# Patient Record
Sex: Male | Born: 1945 | Race: White | Hispanic: No | Marital: Married | Smoking: Former smoker
Health system: Southern US, Community
[De-identification: ages and names within clinical notes are randomized; demographics above are authoritative.]

## PROBLEM LIST (undated history)

## (undated) DIAGNOSIS — K219 Gastro-esophageal reflux disease without esophagitis: Secondary | ICD-10-CM

## (undated) DIAGNOSIS — M549 Dorsalgia, unspecified: Secondary | ICD-10-CM

## (undated) DIAGNOSIS — M1990 Unspecified osteoarthritis, unspecified site: Secondary | ICD-10-CM

## (undated) DIAGNOSIS — I10 Essential (primary) hypertension: Secondary | ICD-10-CM

## (undated) DIAGNOSIS — I2510 Atherosclerotic heart disease of native coronary artery without angina pectoris: Secondary | ICD-10-CM

## (undated) DIAGNOSIS — R809 Proteinuria, unspecified: Secondary | ICD-10-CM

## (undated) DIAGNOSIS — G8929 Other chronic pain: Secondary | ICD-10-CM

## (undated) DIAGNOSIS — N200 Calculus of kidney: Secondary | ICD-10-CM

## (undated) DIAGNOSIS — I219 Acute myocardial infarction, unspecified: Secondary | ICD-10-CM

## (undated) HISTORY — DX: Dorsalgia, unspecified: M54.9

## (undated) HISTORY — DX: Calculus of kidney: N20.0

## (undated) HISTORY — DX: Proteinuria, unspecified: R80.9

## (undated) HISTORY — PX: TONSILLECTOMY: SUR1361

## (undated) HISTORY — DX: Atherosclerotic heart disease of native coronary artery without angina pectoris: I25.10

## (undated) HISTORY — PX: CARDIAC CATHETERIZATION: SHX172

## (undated) HISTORY — DX: Other chronic pain: G89.29

## (undated) HISTORY — PX: ROTATOR CUFF REPAIR: SHX139

## (undated) HISTORY — PX: HERNIA REPAIR: SHX51

## (undated) HISTORY — DX: Essential (primary) hypertension: I10

---

## 2007-07-11 ENCOUNTER — Ambulatory Visit (HOSPITAL_COMMUNITY): Admission: RE | Admit: 2007-07-11 | Discharge: 2007-07-11 | Payer: Self-pay | Admitting: Urology

## 2007-07-12 ENCOUNTER — Ambulatory Visit (HOSPITAL_COMMUNITY): Admission: RE | Admit: 2007-07-12 | Discharge: 2007-07-12 | Payer: Self-pay | Admitting: Urology

## 2007-07-19 ENCOUNTER — Ambulatory Visit (HOSPITAL_COMMUNITY): Admission: RE | Admit: 2007-07-19 | Discharge: 2007-07-19 | Payer: Self-pay | Admitting: Urology

## 2007-07-19 IMAGING — CR DG ABDOMEN 1V
2 series · 2 of 2 positions shown · non-contrast
Comparison: [DATE].

CLINICAL DATA: Kidney stone.

ABDOMEN - 1 VIEW

[view not recorded (1 of 2)]
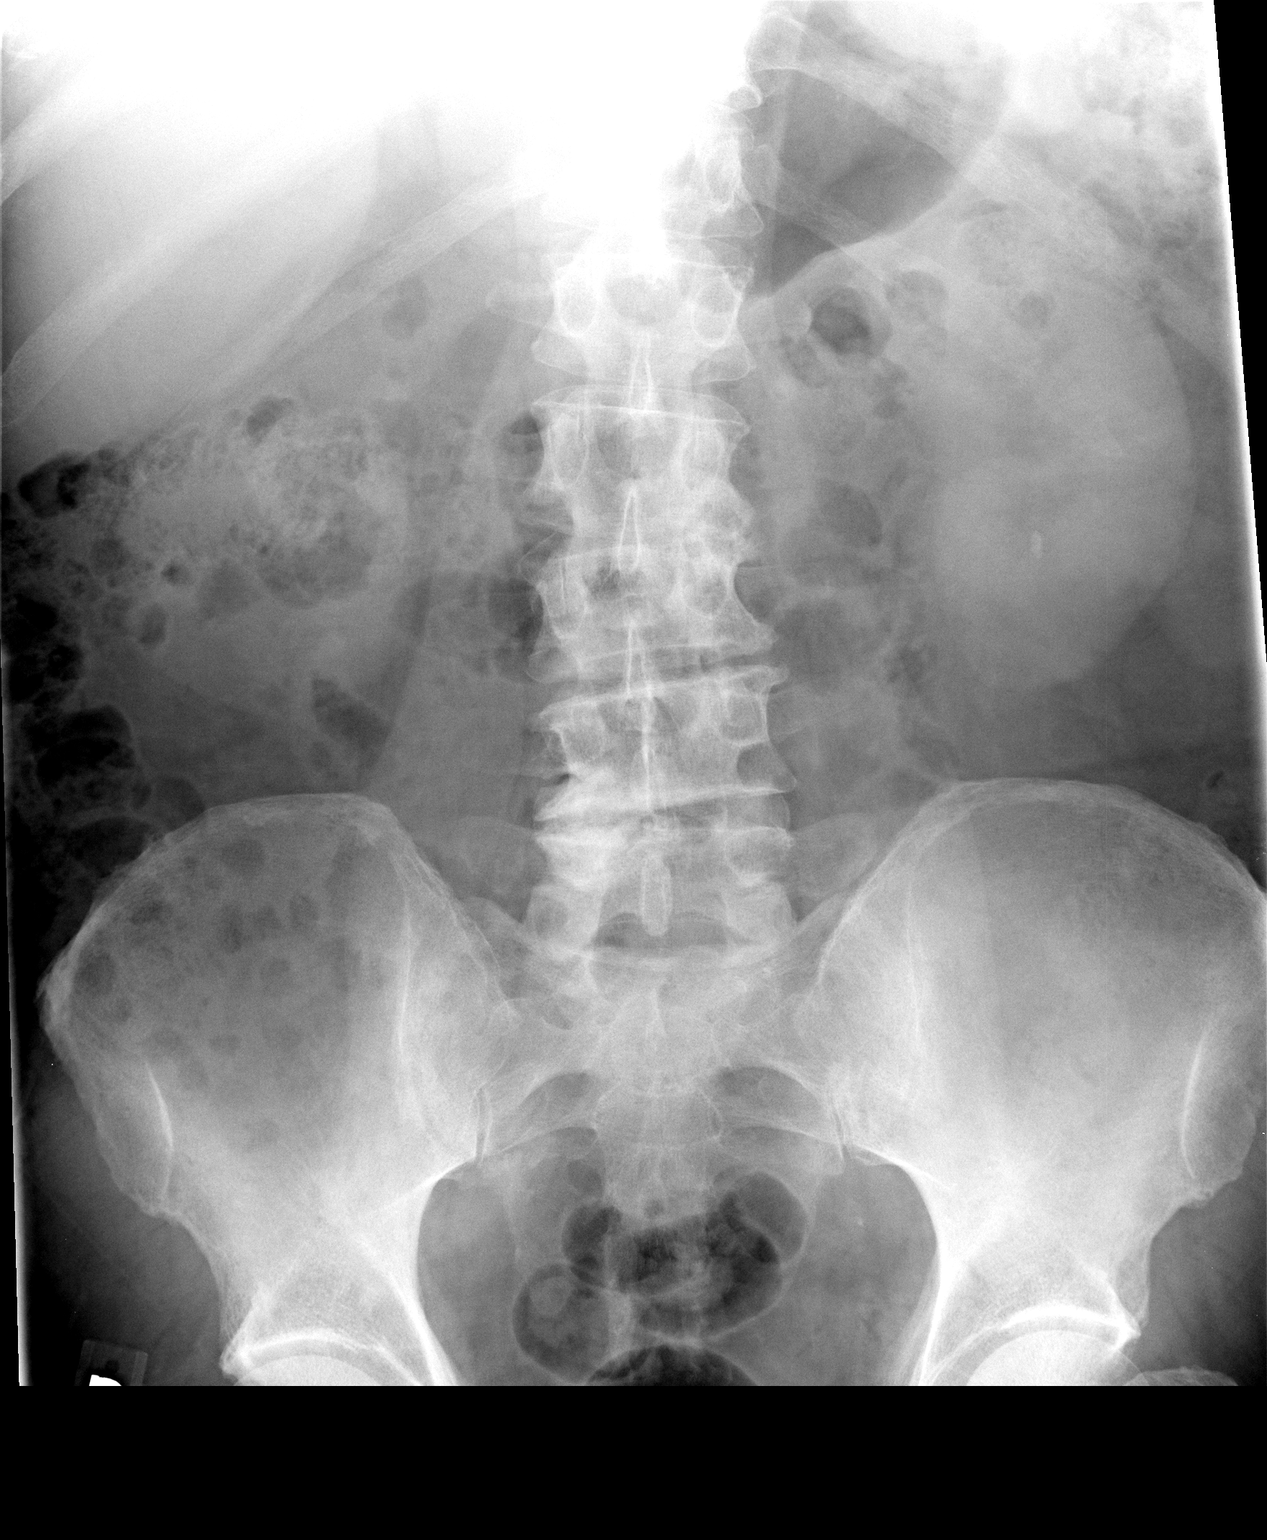

[view not recorded (2 of 2)]
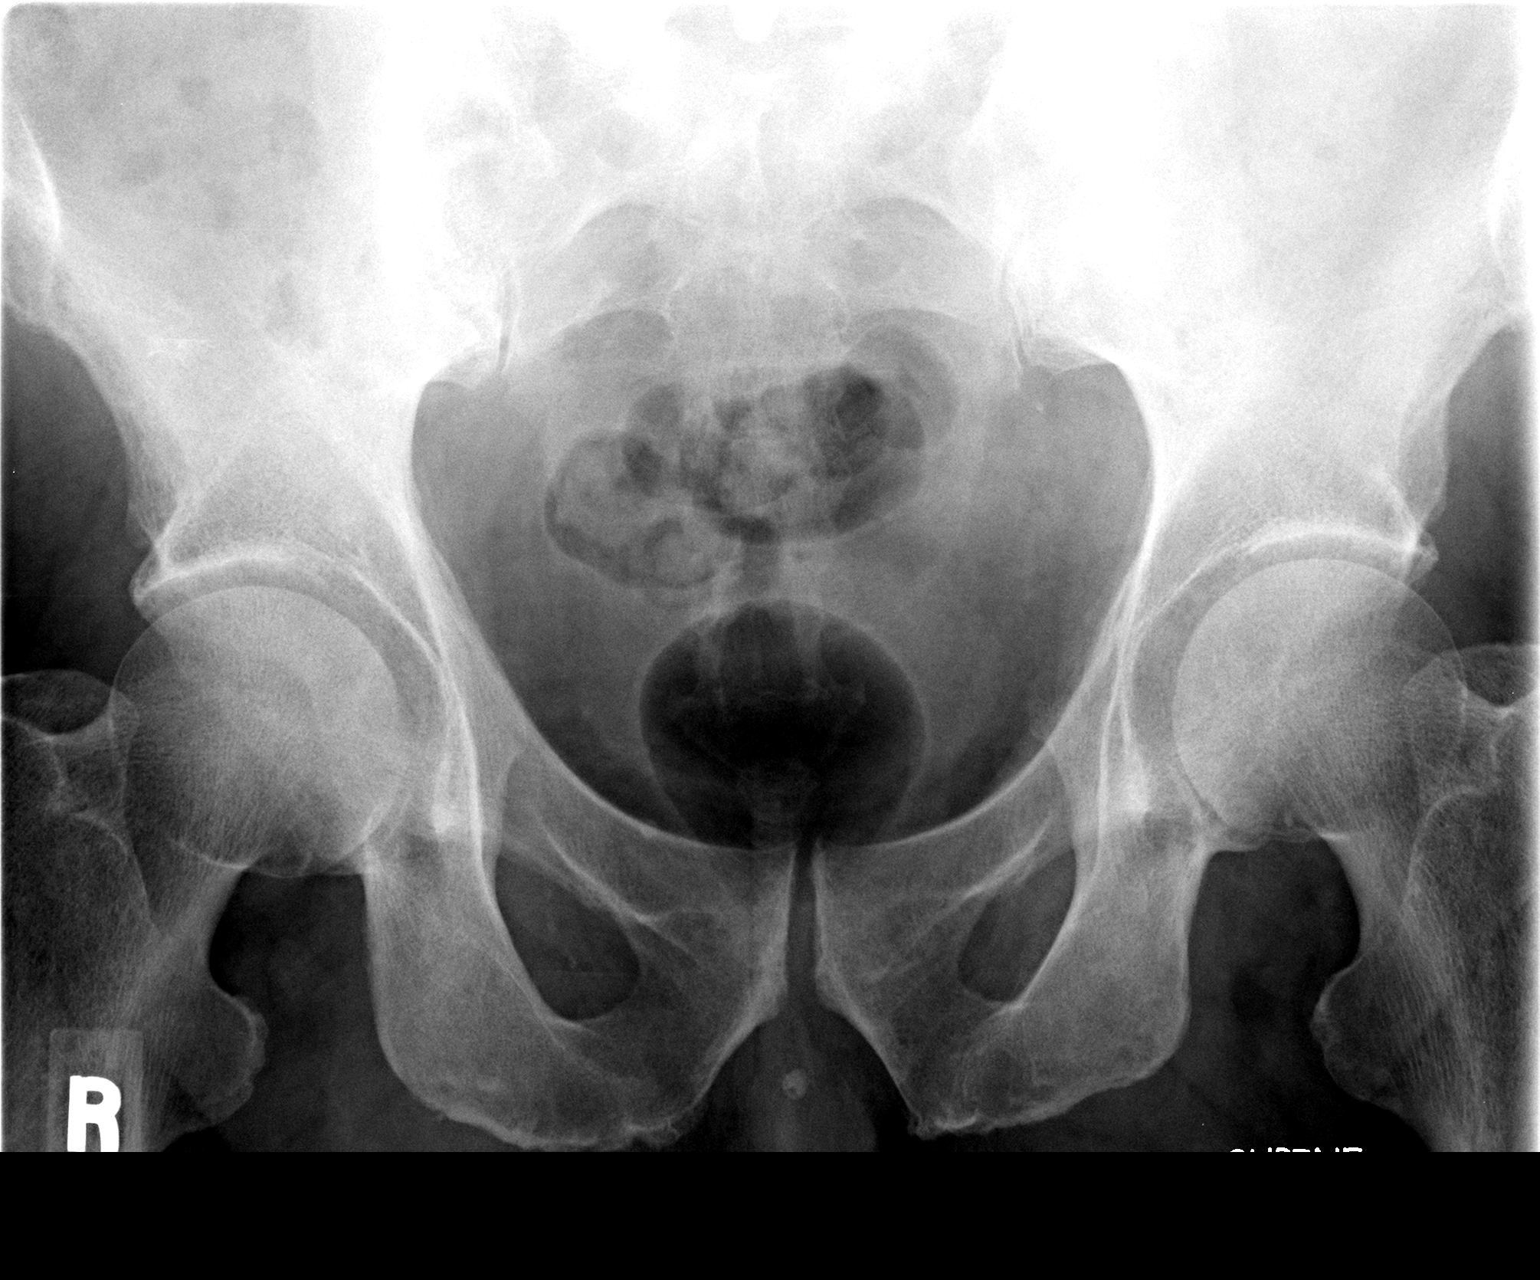

[2 of 2 positions shown; findings below may reference images not displayed]

FINDINGS: An 8 mm stone is seen projecting over the lower pole of
the left renal outline.  No definite stones project over the right
renal outline or expected course of the ureters bilaterally.
IMPRESSION: 1.  Probable left kidney stone.

## 2008-03-08 DIAGNOSIS — I219 Acute myocardial infarction, unspecified: Secondary | ICD-10-CM

## 2008-03-08 HISTORY — DX: Acute myocardial infarction, unspecified: I21.9

## 2008-04-26 ENCOUNTER — Encounter: Payer: Self-pay | Admitting: Cardiology

## 2008-04-27 ENCOUNTER — Encounter: Payer: Self-pay | Admitting: Cardiology

## 2008-04-27 ENCOUNTER — Ambulatory Visit: Payer: Self-pay | Admitting: Cardiovascular Disease

## 2008-04-27 ENCOUNTER — Inpatient Hospital Stay (HOSPITAL_COMMUNITY): Admission: AD | Admit: 2008-04-27 | Discharge: 2008-04-30 | Payer: Self-pay | Admitting: Cardiovascular Disease

## 2008-04-30 ENCOUNTER — Encounter: Payer: Self-pay | Admitting: Cardiology

## 2008-05-20 ENCOUNTER — Ambulatory Visit: Payer: Self-pay | Admitting: Cardiology

## 2008-05-21 ENCOUNTER — Encounter: Payer: Self-pay | Admitting: Cardiology

## 2008-06-28 ENCOUNTER — Encounter: Payer: Self-pay | Admitting: Cardiology

## 2008-12-06 DIAGNOSIS — I2510 Atherosclerotic heart disease of native coronary artery without angina pectoris: Secondary | ICD-10-CM | POA: Insufficient documentation

## 2008-12-06 DIAGNOSIS — I10 Essential (primary) hypertension: Secondary | ICD-10-CM

## 2009-03-03 ENCOUNTER — Encounter (INDEPENDENT_AMBULATORY_CARE_PROVIDER_SITE_OTHER): Payer: Self-pay | Admitting: *Deleted

## 2009-04-08 ENCOUNTER — Telehealth (INDEPENDENT_AMBULATORY_CARE_PROVIDER_SITE_OTHER): Payer: Self-pay | Admitting: *Deleted

## 2009-05-20 ENCOUNTER — Ambulatory Visit: Payer: Self-pay | Admitting: Cardiology

## 2009-05-20 DIAGNOSIS — E119 Type 2 diabetes mellitus without complications: Secondary | ICD-10-CM | POA: Insufficient documentation

## 2010-04-07 NOTE — Progress Notes (Signed)
Summary: Plavix Question  Phone Note Call from Patient Call back at Home Phone [phone]   Caller: Spouse Summary of Call: Pt's wife called stating he was told he could stop his Plavix after one year. She is wanting to know when he can stop it. Notified pt's wife to have him continue Plavix until he sees Dr. Earnestine Leys at his scheduled appt on March 15. Pt's wife verbalized understanding. Initial call taken by: Cyril Loosen, RN, BSN,  April 08, 2009 11:12 AM

## 2010-04-07 NOTE — Assessment & Plan Note (Signed)
Summary: 1 yr fu would like to come off plavix   Visit Type:  Follow-up Primary Provider:  Sherril Croon  CC:  follow-up visit.  History of Present Illness: the patient is a 65 year old male history of coronary disease , status post successful drug-eluting stent placement to the LAD. The patient has non-insulin dependent diabetes mellitus he has discontinued tobacco use.  The patient has no microalbuminuria. He reports occasional left shoulder tightness but no substernal chest pain or any exertional related chest pain. The pain appears to be non-anginal. He denies orthopnea PND palpitations or syncope.  Preventive Screening-Counseling & Management  Alcohol-Tobacco     Smoking Status: quit     Year Quit: 04/2008  Current Medications (verified): 1)  Metoprolol Tartrate 25 Mg Tabs (Metoprolol Tartrate) .... Take 1/2 Tablet By Mouth Twice A Day. 2)  Plavix 75 Mg Tabs (Clopidogrel Bisulfate) .... Take 1 Tablet By Mouth Once A Day 3)  Aspir-Trin 325 Mg Tbec (Aspirin) .... Take 1 Tablet By Mouth Once A Day 4)  Ranitidine Hcl 150 Mg Tabs (Ranitidine Hcl) .... Take 1 Tablet By Mouth Two Times A Day 5)  Simvastatin 80 Mg Tabs (Simvastatin) .... Take 1 Tablet By Mouth Once A Day 6)  Metformin Hcl 500 Mg Tabs (Metformin Hcl) .... Take 1 Tablet By Mouth Once A Day 7)  Hydrocodone-Acetaminophen 5-500 Mg Tabs (Hydrocodone-Acetaminophen) .... Take 1 Tablet By Mouth Three Times A Day As Needed 8)  Novolin 70/30 70-30 % Susp (Insulin Isophane & Regular) .... Take 32u Subcutaneously in Am and 30u Subcutaneously in Pm  Allergies (verified): No Known Drug Allergies  Comments:  Nurse/Medical Assistant: The patient's medications and allergies were reviewed with the patient and were updated in the Medication and Allergy Lists. List reviewed.  Past History:  Past Medical History: Last updated: 12/06/2008 HYPERTENSION, UNSPECIFIED (ICD-401.9) CAD, NATIVE VESSEL (ICD-414.01) Non-insulin-dependent diabetes  mellitus History of nephrolithiasis Chronic back pain  Past Surgical History: Last updated: 12/06/2008 Rotator Cuff Repair Hernia repair  Family History: Last updated: 12/06/2008 Family History of Cancer:   Social History: Last updated: 12/06/2008 Disabled  Married  Tobacco Use - Yes.  Alcohol Use - no Drug Use - no  Risk Factors: Smoking Status: quit (05/20/2009)  Social History: Smoking Status:  quit  Review of Systems  The patient denies fatigue, malaise, fever, weight gain/loss, vision loss, decreased hearing, hoarseness, chest pain, palpitations, shortness of breath, prolonged cough, wheezing, sleep apnea, coughing up blood, abdominal pain, blood in stool, nausea, vomiting, diarrhea, heartburn, incontinence, blood in urine, muscle weakness, joint pain, leg swelling, rash, skin lesions, headache, fainting, dizziness, depression, anxiety, enlarged lymph nodes, easy bruising or bleeding, and environmental allergies.    Vital Signs:  Patient profile:   65 year old male Height:      68 inches Weight:      216 pounds BMI:     32.96 Pulse rate:   54 / minute BP sitting:   134 / 82  (left arm) Cuff size:   large  Vitals Entered By: Carlye Grippe (May 20, 2009 10:01 AM)  Nutrition Counseling: Patient's BMI is greater than 25 and therefore counseled on weight management options. CC: follow-up visit   Physical Exam  Additional Exam:  General: Well-developed, well-nourished in no distress head: Normocephalic and atraumatic eyes PERRLA/EOMI intact, conjunctiva and lids normal nose: No deformity or lesions mouth normal dentition, normal posterior pharynx neck: Supple, no JVD.  No masses, thyromegaly or abnormal cervical nodes lungs: Normal breath sounds bilaterally without wheezing.  Normal percussion heart: regular rate and rhythm with normal S1 and S2, no S3 or S4.  PMI is normal.  No pathological murmurs abdomen: Normal bowel sounds, abdomen is soft and nontender  without masses, organomegaly or hernias noted.  No hepatosplenomegaly musculoskeletal: Back normal, normal gait muscle strength and tone normal pulsus: Pulse is normal in all 4 extremities Extremities: No peripheral pitting edema neurologic: Alert and oriented x 3 skin: Intact without lesions or rashes cervical nodes: No significant adenopathy psychologic: Normal affect    EKG  Procedure date:  05/20/2009  Findings:      sinus bradycardia. Otherwise normal EKG. Heart rate 51 beats per minute.  Impression & Recommendations:  Problem # 1:  CAD, NATIVE VESSEL (ICD-414.01) the patient denies intercurrent chest pain. His EKG is within normal limits. Continue aspirin and Plavix. His updated medication list for this problem includes:    Metoprolol Tartrate 25 Mg Tabs (Metoprolol tartrate) .Marland Kitchen... Take 1/2 tablet by mouth twice a day.    Plavix 75 Mg Tabs (Clopidogrel bisulfate) .Marland Kitchen... Take 1 tablet by mouth once a day    Aspir-trin 325 Mg Tbec (Aspirin) .Marland Kitchen... Take 1 tablet by mouth once a day  Orders: EKG w/ Interpretation (93000)  Problem # 2:  HYPERTENSION, UNSPECIFIED (ICD-401.9) blood pressure is well controlled on metoprolol. His updated medication list for this problem includes:    Metoprolol Tartrate 25 Mg Tabs (Metoprolol tartrate) .Marland Kitchen... Take 1/2 tablet by mouth twice a day.    Aspir-trin 325 Mg Tbec (Aspirin) .Marland Kitchen... Take 1 tablet by mouth once a day  Problem # 3:  DM (ICD-250.00) no evidence of microalbuminuria. Patient started on metformin. As well as Novolin. The patient will follow up with his primary care physician. The following medications were removed from the medication list:    Glipizide 5 Mg Tabs (Glipizide) .Marland Kitchen... Take 2 tablet by mouth two times a day His updated medication list for this problem includes:    Aspir-trin 325 Mg Tbec (Aspirin) .Marland Kitchen... Take 1 tablet by mouth once a day    Metformin Hcl 500 Mg Tabs (Metformin hcl) .Marland Kitchen... Take 1 tablet by mouth once a day     Novolin 70/30 70-30 % Susp (Insulin isophane & regular) .Marland Kitchen... Take 32u subcutaneously in am and 30u subcutaneously in pm  Patient Instructions: 1)  Your physician recommends that you continue on your current medications as directed. Please refer to the Current Medication list given to you today. 2)  Follow up in  1 year.

## 2010-06-17 ENCOUNTER — Ambulatory Visit: Payer: Self-pay | Admitting: Cardiology

## 2010-06-19 ENCOUNTER — Ambulatory Visit (INDEPENDENT_AMBULATORY_CARE_PROVIDER_SITE_OTHER): Payer: Medicare Other | Admitting: Cardiology

## 2010-06-19 ENCOUNTER — Encounter: Payer: Self-pay | Admitting: Cardiology

## 2010-06-19 VITALS — BP 115/72 | HR 61 | Ht 69.0 in | Wt 222.0 lb

## 2010-06-19 DIAGNOSIS — E119 Type 2 diabetes mellitus without complications: Secondary | ICD-10-CM

## 2010-06-19 DIAGNOSIS — I2510 Atherosclerotic heart disease of native coronary artery without angina pectoris: Secondary | ICD-10-CM

## 2010-06-19 DIAGNOSIS — Z9861 Coronary angioplasty status: Secondary | ICD-10-CM

## 2010-06-19 MED ORDER — LISINOPRIL 5 MG PO TABS: 5.0000 mg | ORAL_TABLET | Freq: Every day | ORAL | Status: DC

## 2010-06-19 NOTE — Patient Instructions (Signed)
   Stop Plavix  Begin Lisinopril 5mg  daily Your physician recommends that you go to the [facility] for lab work in 7-10 days - BMET If the results of your test are normal or stable, you will receive a letter.  If they are abnormal, the nurse will contact you by phone. Your physician wants you to follow up in:  1 year.  You will receive a reminder letter in the mail one-two months in advance.  If you don't receive a letter, please call our office to schedule the follow up appointment

## 2010-06-19 NOTE — Progress Notes (Signed)
HPI The patient is a 65 year old male with a history of coronary artery disease, status post drug-eluting stent placement to the LAD. He has multiple cardiac risk factors including diabetes mellitus. He has discontinued tobacco use. He has a history of microalbuminuria. Blood work done in November of 2011 demonstrated normal liver function tests. HDL cholesterol 34 LDL cholesterol 53 triglycerides of 257. CBC and TSH was within normal limits. The patient has not been taking Plavix for well over 2 years after his stent placement. Is doing well from a cardiovascular standpoint. She denies any chest pain shortness of breath. He has a recent shoulder pain but this resolved after 2 or 3 days. He denies any exertional component. Despite the fact that the patient has mitral albuminuria he apparently is not on an ACE inhibitor  No Known Allergies  Current Outpatient Prescriptions on File Prior to Visit  Medication Sig Dispense Refill  . aspirin 325 MG tablet Take 325 mg by mouth daily.        Marland Kitchen HYDROcodone-acetaminophen (VICODIN) 5-500 MG per tablet Take 1 tablet by mouth every 8 (eight) hours as needed.       . insulin NPH-insulin regular (NOVOLIN 70/30) (70-30) 100 UNIT/ML injection Inject into the skin.        . metFORMIN (GLUCOPHAGE) 500 MG tablet Take 500 mg by mouth 2 (two) times daily with a meal.       . metoprolol tartrate (LOPRESSOR) 25 MG tablet Take 25 mg by mouth daily. Take 1/2 tab bid       . ranitidine (ZANTAC) 150 MG capsule Take 150 mg by mouth 2 (two) times daily.        . simvastatin (ZOCOR) 80 MG tablet Take 80 mg by mouth at bedtime.        Marland Kitchen DISCONTD: clopidogrel (PLAVIX) 75 MG tablet Take 75 mg by mouth daily.          Past Medical History  Diagnosis Date  . Hypertension   . CAD (coronary artery disease)     native vessel  . Diabetes mellitus     non insulin dependant  . Nephrolithiasis   . Chronic back pain     Past Surgical History  Procedure Date  . Rotator cuff  repair   . Hernia repair     No family history on file.  History   Social History  . Marital Status: Married    Spouse Name: N/A    Number of Children: N/A  . Years of Education: N/A   Occupational History  . disabled    Social History Main Topics  . Smoking status: Former Smoker    Quit date: 04/26/2008  . Smokeless tobacco: Not on file  . Alcohol Use: No  . Drug Use: No  . Sexually Active: Not on file   Other Topics Concern  . Not on file   Social History Narrative  . No narrative on file   Review of systems:Pertinent positives as outlined above. The remainder of the 18  point review of systems is negative  PHYSICAL EXAM BP 115/72  Pulse 61  Ht 5\' 9"  (1.753 m)  Wt 222 lb (100.699 kg)  BMI 32.78 kg/m2  General: Well-developed, well-nourished in no distress Head: Normocephalic and atraumatic Eyes:PERRLA/EOMI intact, conjunctiva and lids normal Ears: No deformity or lesions Mouth:normal dentition, normal posterior pharynx Neck: Supple, no JVD.  No masses, thyromegaly or abnormal cervical nodes Lungs: Normal breath sounds bilaterally without wheezing.  Normal percussion Cardiac: regular rate  and rhythm with normal S1 and S2, no S3 or S4.  PMI is normal.  No pathological murmurs Abdomen: Normal bowel sounds, abdomen is soft and nontender without masses, organomegaly or hernias noted.  No hepatosplenomegaly MSK: Back normal, normal gait muscle strength and tone normal Vascular: Pulse is normal in all 4 extremities Extremities: No peripheral pitting edema Neurologic: Alert and oriented x 3 Skin: Intact without lesions or rashes Lymphatics: No significant adenopathy Psychologic: Normal affect  ZOX:WRUEAV sinus rhythm normal EKG heart rate 60 beats per minute  ASSESSMENT AND PLAN

## 2010-06-19 NOTE — Assessment & Plan Note (Signed)
We'll start the patient on lisinopril 5 mg a day and check an electrolyte panel in one week. Blood pressure is well controlled.

## 2010-06-19 NOTE — Assessment & Plan Note (Signed)
At this point the patient can stop Plavix. Been taking Plavix for greater than 2 years he can continue on full dose aspirin.

## 2010-06-23 LAB — BASIC METABOLIC PANEL
BUN: 11 mg/dL (ref 6–23)
Calcium: 8.6 mg/dL (ref 8.4–10.5)
Calcium: 9 mg/dL (ref 8.4–10.5)
Chloride: 103 mEq/L (ref 96–112)
Creatinine, Ser: 0.9 mg/dL (ref 0.4–1.5)
Creatinine, Ser: 0.99 mg/dL (ref 0.4–1.5)
GFR calc Af Amer: >60 mL/min (ref >60)
GFR calc Af Amer: >60 mL/min (ref >60)
GFR calc non Af Amer: >60 mL/min (ref >60)

## 2010-06-23 LAB — CBC
HCT: 40.2 % (ref 39.0–52.0)
MCHC: 34.8 g/dL (ref 30.0–36.0)
MCHC: 35.3 g/dL (ref 30.0–36.0)
MCV: 92.8 fL (ref 78.0–100.0)
MCV: 93 fL (ref 78.0–100.0)
Platelets: 170 10*3/uL (ref 150–400)
Platelets: 170 10*3/uL (ref 150–400)
RBC: 4.39 MIL/uL (ref 4.22–5.81)
RDW: 12.9 % (ref 11.5–15.5)
RDW: 12.9 % (ref 11.5–15.5)

## 2010-06-23 LAB — CARDIAC PANEL(CRET KIN+CKTOT+MB+TROPI)
Total CK: 329 U/L — ABNORMAL HIGH (ref 7–232)
Troponin I: 2.8 ng/mL (ref 0.00–0.06)

## 2010-06-23 LAB — PROTIME-INR: Prothrombin Time: 13.4 seconds (ref 11.6–15.2)

## 2010-06-23 LAB — GLUCOSE, CAPILLARY
Glucose-Capillary: 104 mg/dL — ABNORMAL HIGH (ref 70–99)
Glucose-Capillary: 125 mg/dL — ABNORMAL HIGH (ref 70–99)
Glucose-Capillary: 148 mg/dL — ABNORMAL HIGH (ref 70–99)
Glucose-Capillary: 164 mg/dL — ABNORMAL HIGH (ref 70–99)
Glucose-Capillary: 170 mg/dL — ABNORMAL HIGH (ref 70–99)

## 2010-06-23 LAB — HEPARIN LEVEL (UNFRACTIONATED)
Heparin Unfractionated: 0.18 IU/mL — ABNORMAL LOW (ref 0.30–0.70)
Heparin Unfractionated: 0.25 IU/mL — ABNORMAL LOW (ref 0.30–0.70)
Heparin Unfractionated: 0.62 IU/mL (ref 0.30–0.70)

## 2010-07-21 NOTE — Discharge Summary (Signed)
[name], Jason [name] NO.:  [id number]   MEDICAL RECORD NO.:  [id number]          PATIENT TYPE:  INP   LOCATION:  2508                         FACILITY:  [facility]   PHYSICIAN:  Noralyn Pick. Eden Emms, MD, FACCDATE OF BIRTH:  Jul 20, 1945   DATE OF ADMISSION:  04/27/2008  DATE OF DISCHARGE:  04/30/2008                               DISCHARGE SUMMARY   PRIMARY CARDIOLOGIST:  Learta Codding, MD,FACC   PRIMARY CARE Madesyn Ast:  Doreen Beam, MD in [place].   DISCHARGE DIAGNOSIS:  Non-ST-segment elevation myocardial infarction.   SECONDARY DIAGNOSES:  1. Coronary artery disease status post successful drug-eluting stent      placement in the left anterior descending on this admission.  2. Non-insulin-dependent diabetes mellitus.  3. Ongoing tobacco abuse.  4. Hypertension.  5. History of nephrolithiasis status post lithotripsy.  6. Chronic back pain.  7. Status post hernia repair.  8. Status post rotator cuff repair.   ALLERGIES:  No known drug allergies.   PROCEDURES:  Left heart cardiac catheterization and successful  percutaneous coronary intervention and stenting of the left anterior  descending with placement of 3.5 x 18 mm XIENCE V drug-eluting stent.   HISTORY OF PRESENT ILLNESS:  A 65 year old Caucasian male with the above  problem list.  He was in his usual state of health until April 26, 2008, when he experienced chest pain while getting dressed in the  morning.  Symptoms radiated to both shoulders, associated with nausea,  vomiting, dyspnea, and diaphoresis.  He drove to [facility] where  he was admitted and subsequently ruled in for non-ST-elevation MI with a  CK-MB of 6.0 and troponin I of 0.24.  The patient was transferred to  [facility] for further evaluation.   HOSPITAL COURSE:  [name] had no additional chest discomfort while  at [facility].  He was maintained on aspirin, heparin, beta-blocker, and  statin therapy over the weekend and underwent  left heart cardiac  catheterization on April 29, 2008, revealing 99% calcified stenosis  in the proximal LAD and otherwise, nonobstructive disease throughout his  coronary tree.  EF was 60%.  Films were reviewed, and it was felt that  he would benefit from stenting of the LAD.  Subsequently, he underwent  PCI and stenting of the proximal LAD with placement of 3.5 x 18 mm  XIENCE V drug-eluting stent.  The patient tolerated the procedure well.  Postprocedure, he has been ambulating without recurrent symptoms or  limitations.  We will plan to discharge him home today in good  condition.   DISCHARGE LABORATORY DATA:  Hemoglobin 14.2, hematocrit 40.7, WBC 8.9,  platelets 170.  INR 1.0.  Sodium 135, potassium 3.8, chloride 101, CO2  28, BUN 11, creatinine 0.90, glucose 129.  CK 329, MB 40.3, troponin I  2.80.   DISPOSITION:  The patient will be discharged home today in good  condition.  Followup appointment will be arranged to follow up with Dr.  Andee Lineman in our [facility] on May 20, 2008, at 2:15 p.m.  He is asked  to follow up with Dr. Sherril Croon  as previously scheduled.   DISCHARGE MEDICATIONS:  1. Aspirin 325 mg daily.  2. Plavix 75 mg 2 tablets daily x1 week, then 1 tablet daily.  3. Lopressor 25 mg half a tablet b.i.d.  4. Simvastatin 40 mg nightly.  5. Metformin 1000 mg b.i.d. to be resumed on May 02, 2008.  6. Glipizide 10 mg b.i.d.  7. Vicodin 5/500 mg q.4-6 h. p.r.n., back pain as previously      prescribed.  8. Zantac 150 mg b.i.d.  9. Nitroglycerin 0.4 mg sublingual p.r.n. chest pain.   He has been counseled on the importance of smoking cessation.   OUTSTANDING LABORATORY STUDIES:  None.   DURATION OF DISCHARGE ENCOUNTER:  40 minutes including physician time.      Nicolasa Ducking, ANP      Noralyn Pick. Eden Emms, MD, [facility]  Electronically Signed    CB/MEDQ  D:  04/30/2008  T:  04/30/2008  Job:  [id number]   cc:   Doreen Beam, MD

## 2010-07-21 NOTE — Cardiovascular Report (Signed)
NAMELUCILE, [name] NO.:  [id number]   MEDICAL RECORD NO.:  [id number]          PATIENT TYPE:  INP   LOCATION:  2508                         FACILITY:  [facility]   PHYSICIAN:  Veverly Fells. Excell Seltzer, MD  DATE OF BIRTH:  15-Oct-1945   DATE OF PROCEDURE:  04/29/2008  DATE OF DISCHARGE:                            CARDIAC CATHETERIZATION   PROCEDURE:  Percutaneous transluminal coronary angioplasty and stenting  of the proximal left anterior descending.   INDICATIONS:  Jason [name] is a 65 year old diabetic gentleman who  presented with an acute coronary syndrome.  He ruled in for a non-ST-  elevation MI.  He underwent diagnostic catheterization this morning by  Dr. Gala Romney.  This demonstrated a 95-99% ulcerated lesion in the  proximal to mid LAD.  He was referred for percutaneous coronary  intervention in the setting of severe single-vessel coronary artery  disease.   Risks and indications of the procedure were reviewed with the patient  and informed consent was obtained.  There was an indwelling 5-French  sheath in the right femoral artery.  The right groin was reprepped.  The  sheath was changed out for a 6-French sheath using normal sterile  technique.  Angiomax was started for anticoagulation.  The patient was  pretreated with 600 mg of Plavix.  An XB 3.5 cm guide catheter was  inserted.  Initial angiographic images confirmed high-grade stenosis of  the proximal LAD.  The vessel was wired without difficulty using a  Cougar wire.  The wire was passed after a therapeutic ACT was achieved.  The lesion was then predilated with a 2.5 x 15-mm Apex balloon which was  taken to 12 atmospheres with good expansion.  The balloon was left in  place and an angiogram was performed to evaluate lesion length.  The  entire lesion looked like it could be covered with an 18-mm stent.  A  3.5 x 18 mm Xience drug-eluting stent was chosen.  It was carefully  positioned and then deployed at  12 atmospheres.  The stent appeared well  expanded.  I elected to postdilate the stent with a 3.75 x 15-mm [place]  Voyager balloon which was positioned and inflated to 16 atmospheres on  two inflations covering the entire stented segment.  Following  postdilatation, there was slow flow in the vessel.  There is a diagonal  branch that the stent crossed.  It has minor ostial narrowing but it had  the same flow as the main LAD branch.  The patient developed chest  discomfort that I suspected was due to mild no reflow phenomenon from  stenting.  I elected to use intracoronary verapamil which was given in  100 mcg aliquot.  A total of 700 mcg was given.  Following  administration of verapamil, there was improvement in flow back to  normal, TIMI III flow.  The patient's chest pain had eased up and he  remained hemodynamically stable throughout.  I felt that he had achieved  a maximum benefit of his intervention.  The right femoral artery was not  closed because the patient had been in the  holding area and the sheath  had to be changed out.   FINAL CONCLUSIONS:  Successful stenting of the proximal LAD using a  single Xience drug-eluting stent.   RECOMMENDATIONS:  Recommend Plavix and aspirin in combination for at  least 12 months.  Plavix will be given twice daily for the first week in  the setting of the patient's non-ST elevation MI.      Veverly Fells. Excell Seltzer, MD  Electronically Signed     MDC/MEDQ  D:  04/29/2008  T:  04/29/2008  Job:  [id number]   cc:   Doreen Beam, MD  Noralyn Pick. Eden Emms, MD, [facility]

## 2010-07-21 NOTE — H&P (Signed)
Jason [name], Jason [name] NO.:  [id number]   MEDICAL RECORD NO.:  [id number]          PATIENT TYPE:  AMB   LOCATION:  DAY                           FACILITY:  [facility]   PHYSICIAN:  Ky Barban, M.D.DATE OF BIRTH:  [birth year]   DATE OF ADMISSION:  DATE OF DISCHARGE:  LH                              HISTORY & PHYSICAL   This patient is 65 years old and is coming to have ESL done for left  ureteral calculus at [facility] today.   CHIEF COMPLAINT:  Recurrent left renal colic.   HISTORY:  A 65 year old gentleman was referred over here by Dr. Sherril Croon  because he is having this pain for several days.  He was sent to have a  CT scan.  It shows there is a stone 7 mm in the left upper proximal  ureter.  The patient was referred to me.  I have done a KUB.  The stone  is easily visible, so I have advised the patient to undergo ESL for  which he is coming as outpatient.  Denies any fever, chills or any  voiding difficulty.   PAST HISTORY:  1. He has diabetes, non-insulin dependent.  2. Umbilical hernia repair in 2000.  3. No prostate cancer.  4. No history of kidney stones in the past.   PERSONAL HISTORY:  Smokes half pack a day, does not drink.   REVIEW OF SYSTEMS:  Unremarkable.   PHYSICAL EXAMINATION:  VITAL SIGNS:  Blood pressure 158/89, temperature  is 97.5.  CENTRAL NERVOUS SYSTEM:  Negative.  HEENT:  Negative.  CHEST:  Symmetrical.  HEART:  Regular sinus rhythm, no murmur.  ABDOMEN:  Soft, flat.  Liver, spleen, kidneys are not palpable, 1+ left  CVA tenderness. EXTERNAL GENITALIA:  He is uncircumcised.  Meatus  adequate.  Testicles are normal.  RECTAL:  Sphincter tone is normal.  No rectal mass.  Prostate 1.5+,  smooth.   IMPRESSION:  1. Left ureteral calculus.  2. Non-insulin dependent diabetes.   PLAN:  ESL left ureteral calculus.  I have discussed the procedure  limitations, complications, need for additional procedure.  He  understands and  wanted me to go ahead and proceed with that.      Ky Barban, M.D.  Electronically Signed     MIJ/MEDQ  D:  07/12/2007  T:  07/12/2007  Job:  [id number]

## 2010-07-21 NOTE — H&P (Signed)
Jason [name], Jason [name] NO.:  [id number]   MEDICAL RECORD NO.:  [id number]          PATIENT TYPE:  INP   LOCATION:  4705                         FACILITY:  [facility]   PHYSICIAN:  Noralyn Pick. Eden Emms, MD, FACCDATE OF BIRTH:  14-Feb-1946   DATE OF ADMISSION:  04/27/2008  DATE OF DISCHARGE:                              HISTORY & PHYSICAL   PRIMARY CARDIOLOGIST:  [place], Dr. Charlton Haws.   PRIMARY CARE PHYSICIAN:  Doreen Beam, MD in [place].   HISTORY OF PRESENT ILLNESS:  A 65 year old Caucasian male with known  history of diabetes and spinal disease, experienced chest pain yesterday  a.m. while getting dressed.  He described it as severe 10/10, felt like  his chest would explode, radiating to both shoulders.  He had associated  nausea and vomiting, shortness of breath, and diaphoresis.  The patient  drove himself to [facility] ER where he was admitted overnight  and was found to have positive cardiac enzymes.  EKG did not show any  acute changes.  Pain lasted approximately 30 minutes in duration.  He  states that he never had chest pain like that before.  He did experience  some heartburn the night before which was intermittent.  As a result of  positive cardiac enzymes, the patient was transferred to [facility] by Dr. Sherril Croon after speaking with Dr. Eden Emms who accepted the  patient and will plan cardiac catheterization.   REVIEW OF SYSTEMS:  Positive for diaphoresis, chest pain, shortness of  breath, dyspnea on exertion, nausea, and vomiting.  All other systems  are reviewed and found to be negative.   PAST MEDICAL HISTORY:  Non-insulin-dependent diabetes, nephrolithiasis,  chronic back pain, and hypertension.   PAST SURGICAL HISTORY:  Hernia repair, rotator cuff repair, and  lithotripsy.   SOCIAL HISTORY:  He lives in [place] with his wife and daughter.  He is  unemployed and disabled secondary to his back.  He is a 40-plus-pack-a-  year smoker.  Negative EtOH.   Negative drug use.  His mother is alive  and in good health.  His father deceased from lung cancer.  His siblings  are in good health with no history of diabetes or CAD.   CURRENT MEDICATIONS AT HOME:  Glipizide 5 mg b.i.d.   CURRENT LABORATORY DATA:  Sodium 134, potassium 4.0, chloride 98, CO2 of  29, BUN 11, creatinine 0.80, and glucose 257.  Hemoglobin 16.2,  hematocrit 46.1, white blood cells 8.8, and platelets 200.  Troponin  less than 0.01, 0.24, and 1.14 respectively.   PHYSICAL EXAMINATION:  VITAL SIGNS:  Blood pressure 92/56, heart rate  64, and respirations 10.  HEENT:  Head is normocephalic and atraumatic.  Eyes, PERRLA.  Mucous  membranes and mouth pink and moist.  Tongue is midline.  NECK:  Supple without JVD or carotid bruits appreciated.  CARDIOVASCULAR:  Regular rate and rhythm without murmurs, rubs, or  gallops.  Pulses are 2+ and equal without bruits.  There is no femoral  bruit auscultated.  LUNGS:  Clear to auscultation without wheezes, rales, or rhonchi.  ABDOMEN:  Soft and nontender, 2+ bowel sounds.  EXTREMITIES:  Without clubbing, cyanosis, or edema.  NEURO:  Cranial nerves II-XII are grossly intact.   IMPRESSION:  1. Non-ST-elevated myocardial infarction and positive cardiac enzymes.  2. Diabetes.  3. Ongoing tobacco abuse.   PLAN:  This is a 65 year old Caucasian male patient with known history  of diabetes, who presented to [facility] secondary to severe  chest pain, found to have positive cardiac enzymes and transferred to  [facility] for further cardiac evaluation and testing.  He has  been seen and examined by myself and Dr. Charlton Haws and we will plan  cardiac catheterization on Monday, April 29, 2008.  In the interim,  the patient will be on heparin.  We will hold his Glucophage and  continue him on sliding scale insulin.  The patient was placed on low-  dose statin, and tobacco cessation counseling will be had during   hospitalization.  We will make further recommendations throughout  hospital course depending upon the patient's response to treatment and  results of cath.      Bettey Mare. Lyman Bishop, NP      Noralyn Pick. Eden Emms, MD, [facility]  Electronically Signed    KML/MEDQ  D:  04/27/2008  T:  04/28/2008  Job:  [id number]   cc:   Doreen Beam, MD

## 2010-07-21 NOTE — Cardiovascular Report (Signed)
NAMELOGHAN, KURTZMAN NO.:  [id number]   MEDICAL RECORD NO.:  [id number]          PATIENT TYPE:  INP   LOCATION:  2508                         FACILITY:  [facility]   PHYSICIAN:  Bevelyn Buckles. Bensimhon, MDDATE OF BIRTH:  September 13, 1945   DATE OF PROCEDURE:  04/29/2008  DATE OF DISCHARGE:                            CARDIAC CATHETERIZATION   PRIMARY CARE PHYSICIAN:  Doreen Beam, MD, in [place].   IDENTIFICATION:  Mr. Sheard is a 65 year old male with a history of  diabetes, hypertension, chronic back pain, and ongoing tobacco use, who  was admitted with a non-ST-elevation myocardial infarction.   PROCEDURES PERFORMED:  1. Selective coronary angiography.  2. Left heart catheterization.  3. Left ventriculogram.   DESCRIPTION OF PROCEDURE:  The risks and indication were explained.  Consent was signed and placed on the chart.  The right groin area was  prepped and draped in a routine sterile fashion.  A 5-French arterial  sheath was placed in the right femoral artery using modified Seldinger  technique.  A JL-4 no-torque right coronary catheter and angled pigtail  were used for catheterization and no apparent complications.  Central  aortic pressure 101/59 with a mean of 76.  LV pressure 105/4.  EDP of  11.  There is no aortic stenosis.   Left main had a mid 20% stenosis.   LAD was a long vessel wrapping the apex, gave off 2 small diagonals.  There was a 30% lesion in the proximal LAD and the proximal to mid LAD.  Just after the takeoff of the small first diagonal, there was a high-  grade 99% calcified lesion.  In the distal LAD, there was some mild  plaquing of about 30%.   Left circumflex gave off a tiny OM-1, tiny OM-2, large OM-3, and 2  posterolaterals.  There was a 40% lesion in the mid AV groove circumflex  with diffuse minor plaquing throughout the rest of the circumflex  system.  There was a 30% lesion in the proximal portion of the OM-3.   Right coronary artery  was a very large dominant system, gave off a PL  and 2 posterolaterals.  There was diffuse 30% stenosis throughout the  proximal midsection and a 30% lesion more distally by the  posterolaterals.   Left ventriculogram done in the RAO position showed an EF of 60%.  No  regional wall motion abnormalities.   Abdominal aortogram showed patent renal arteries bilaterally.  The  abdominal aorta was widely patent with no aneurysmal dilatation.   ASSESSMENT:  1. Coronary artery disease with high-grade lesion in the left anterior      descending.  Nonobstructive disease in the left circumflex and      right coronary arteries.  2. Normal left ventricular function.  3. Normal renal arteries and normal abdominal aorta.   PLAN:  He will need percutaneous intervention on the LAD later today  with Dr. Excell Seltzer.  He will also need to aggressive risk factor  modification including smoking cessation.      Bevelyn Buckles. Bensimhon, MD  Electronically Signed     DRB/MEDQ  D:  04/29/2008  T:  04/29/2008  Job:  [id number]   cc:   Doreen Beam, MD

## 2010-07-21 NOTE — Assessment & Plan Note (Signed)
[facility]                          [facility] OFFICE NOTE   NAME:Jason [name], Jason [name]                      MRN:          [id number]  DATE:05/20/2008                            DOB:          06/19/1945    REFERRING PHYSICIAN:  Doreen Beam, MD   HISTORY OF PRESENT ILLNESS:  The patient is a very pleasant 65 year old  male with diabetes and recent tobacco use, although he has discontinued  this practice.  The patient presents with a non-ST-elevation myocardial  infarction on April 27, 2008.  He was found have a high grade LAD  lesion, underwent successful drug-eluting stent placement.  The patient  states that now he is doing well.  He has no shortness of breath,  orthopnea, or PND.  He has no palpitations or syncope.  He has no  complication related to his right groin.   MEDICATIONS:  1. Plavix 75 mg p.o. daily.  2. Aspirin 325 daily.  3. Metoprolol 25 mg half tab p.o. b.i.d.  4. Ranitidine 150 mg p.o. b.i.d.  5. Simvastatin 40 mg p.o. nightly.  6. Glipizide 15 mg 2 tablets p.o. b.i.d.  7. Metformin 1000 mg p.o. b.i.d.   PHYSICAL EXAMINATION:  VITAL SIGNS:  Blood pressure 120/73, heart rate  55, and weight 206.  NECK:  No carotid upstroke.  No carotid bruits.  LUNGS:  Clear breath sounds bilaterally.  HEART:  Regular rate and rhythm.  Normal S1 and S2 and no murmur, rubs,  or gallops.  ABDOMEN:  Soft and nontender.  No rebound or guarding.  Good bowel  sounds.  EXTREMITIES: No cyanosis, clubbing, or edema.  NEUROLOGIC:  The patient is alert, oriented, and grossly nonfocal.   PROBLEM LIST:  1. Coronary artery disease, status post successful drug-eluting stent      placement to the left anterior descending (coronary artery).  2. Non-insulin-dependent diabetes mellitus.  3. Ongoing tobacco use.  4. Hypertension.  5. History of nephrolithiasis, status post lithotripsy.  6. Chronic back pain.  7. Status post hernia repair.  8. Status post  rotator cuff repair.   PLAN:  1. The patient is quite well on post cardiac catheterization and stent      placement.  He will need aspirin and Plavix for at least a year.  2. The patient's diabetes mellitus and we will check his blood and      urine for microalbuminuria, and if present, the patient will need      to go on an ACE inhibitor in the setting of his diabetes and      cardiovascular disease.  3. The patient will follow up with [place] in 6 months.     Learta Codding, MD,FACC  Electronically Signed    GED/MedQ  DD: 05/20/2008  DT: 05/21/2008  Job #: [id number]   cc:   Doreen Beam, MD

## 2011-06-03 ENCOUNTER — Encounter: Payer: Self-pay | Admitting: Cardiology

## 2011-06-03 ENCOUNTER — Ambulatory Visit (INDEPENDENT_AMBULATORY_CARE_PROVIDER_SITE_OTHER): Payer: Medicare Other | Admitting: Cardiology

## 2011-06-03 VITALS — BP 145/78 | HR 76 | Ht 69.0 in | Wt 226.0 lb

## 2011-06-03 DIAGNOSIS — I2510 Atherosclerotic heart disease of native coronary artery without angina pectoris: Secondary | ICD-10-CM

## 2011-06-03 DIAGNOSIS — I10 Essential (primary) hypertension: Secondary | ICD-10-CM

## 2011-06-03 DIAGNOSIS — R809 Proteinuria, unspecified: Secondary | ICD-10-CM

## 2011-06-03 DIAGNOSIS — R0602 Shortness of breath: Secondary | ICD-10-CM

## 2011-06-03 MED ORDER — LISINOPRIL 10 MG PO TABS: 10.0000 mg | ORAL_TABLET | Freq: Every day | ORAL | Status: AC

## 2011-06-03 NOTE — Patient Instructions (Signed)
Your physician you to follow up in 1 year. You will receive a reminder letter in the mail one-two months in advance. If you don't receive a letter, please call our office to schedule the follow-up appointment. Increase Lisinopril to 10 mg daily. You may take _2_ of your _5_ mg tablets daily until gone, and then get new prescription filled for _10_ mg tablets. A new prescription was sent to your pharmacy to reflect this change.   Your physician has requested that you have an echocardiogram. Echocardiography is a painless test that uses sound waves to create images of your heart. It provides your doctor with information about the size and shape of your heart and how well your heart's chambers and valves are working. This procedure takes approximately one hour. There are no restrictions for this procedure.  If the results of your test are normal or stable, you will receive a letter. If they are abnormal, the nurse will contact you by phone.

## 2011-06-05 ENCOUNTER — Encounter: Payer: Self-pay | Admitting: Cardiology

## 2011-06-05 DIAGNOSIS — I2510 Atherosclerotic heart disease of native coronary artery without angina pectoris: Secondary | ICD-10-CM | POA: Insufficient documentation

## 2011-06-05 DIAGNOSIS — R809 Proteinuria, unspecified: Secondary | ICD-10-CM | POA: Insufficient documentation

## 2011-06-05 NOTE — Assessment & Plan Note (Signed)
Increase lisinopril to 10 mg by mouth daily

## 2011-06-05 NOTE — Progress Notes (Signed)
Peyton Bottoms, MD, [facility] ABIM Board Certified in Adult Cardiovascular Medicine,Internal Medicine and Critical Care Medicine    CC: followup patient with coronary artery disease  HPI:  The patient is a 66 year old male with a history of coronary artery disease, status post drug-eluting stent placement to the LAD.  He has multiple cardiac risk factors including diabetes mellitus and hypertension.  He has discontinued tobacco use.  He also has microalbuminuria.  From a cardiac standpoint the patient has been doing well.  He reports no chest pain shortness of breath orthopnea or PND.  The patient actually walks quite a bit.  He continues to have dyslipidemia which is managed by his primary care doctor.I questioned him about the possibility of obstructive sleep apnea.  He appears to have a low Epworth score.  His blood pressure however is uncontrolled today.  The patient also has not had an echocardiogram in quite some time and followup on his ejection fraction.  Otherwise he reports no orthopnea PND or palpitations presyncope or syncope.  PMH: reviewed and listed in Problem List in Electronic Records (and see below) Past Medical History  Diagnosis Date  . Hypertension   . CAD (coronary artery disease)     native vessel, status post drug-eluting stent to the LAD.  . Diabetes mellitus     non insulin dependant  . Nephrolithiasis   . Chronic back pain   . Microalbuminuria     in the setting of diabetes mellitus   Past Surgical History  Procedure Date  . Rotator cuff repair   . Hernia repair     Allergies/SH/FHX : available in Electronic Records for review  No Known Allergies History   Social History  . Marital Status: Married    Spouse Name: N/A    Number of Children: N/A  . Years of Education: N/A   Occupational History  . disabled    Social History Main Topics  . Smoking status: Former Smoker -- 2.0 packs/day for 40 years    Types: Cigarettes    Quit date: 04/26/2008  .  Smokeless tobacco: Never Used  . Alcohol Use: No  . Drug Use: No  . Sexually Active: Not on file   Other Topics Concern  . Not on file   Social History Narrative  . No narrative on file   No family history on file.  Medications: Current Outpatient Prescriptions  Medication Sig Dispense Refill  . aspirin 325 MG tablet Take 325 mg by mouth daily.        . insulin NPH-insulin regular (NOVOLIN 70/30) (70-30) 100 UNIT/ML injection Inject 55 Units into the skin 2 (two) times daily with a meal.       . metFORMIN (GLUCOPHAGE) 500 MG tablet Take 1,000 mg by mouth 2 (two) times daily with a meal.       . metoprolol tartrate (LOPRESSOR) 25 MG tablet Take 25 mg by mouth 2 (two) times daily. Take 1/2 tab bid       . nitroGLYCERIN (NITROSTAT) 0.4 MG SL tablet Place 0.4 mg under the tongue every 5 (five) minutes as needed.        . ranitidine (ZANTAC) 150 MG capsule Take 150 mg by mouth 2 (two) times daily.        . simvastatin (ZOCOR) 80 MG tablet Take 80 mg by mouth at bedtime.        Marland Kitchen lisinopril (PRINIVIL,ZESTRIL) 10 MG tablet Take 1 tablet (10 mg total) by mouth daily.  30 tablet  6    ROS: No nausea or vomiting. No fever or chills.No melena or hematochezia.No bleeding.No claudication  Physical Exam: BP 145/78  Pulse 76  Ht 5\' 9"  (1.753 m)  Wt 226 lb (102.513 kg)  BMI 33.37 kg/m2 General:well-nourished white male in no apparent distress. Neck:normal carotid upstroke and no carotid bruit. Lungs:clear breath sounds.  No wheezing Cardiac:regular rate and rhythm with no murmur rubs or gallops Vascular:no edema.  Normal distal pulses. Skin:warm and dry Physcologic:normal affect  12lead WUJ:WJXBJY sinus rhythm otherwise normal EKG. Limited bedside ECHO:N/A No images are attached to the encounter.    Patient Active Problem List  Diagnoses  . DM  . HYPERTENSION, UNSPECIFIED  . CAD, NATIVE VESSEL  . CAD (coronary artery disease)  . Microalbuminuria    PLAN   Will increase  lisinopril to 10 mg by mouth daily particularly in the setting of microalbuminuria, diabetes mellitus and hypertension  The patient will be scheduled for echocardiogram in the next couple of weeks to assess his LV function.  Plavix was previously discontinued.  Patient can continue aspirin.

## 2011-06-16 ENCOUNTER — Other Ambulatory Visit: Payer: Self-pay

## 2011-06-16 ENCOUNTER — Other Ambulatory Visit (INDEPENDENT_AMBULATORY_CARE_PROVIDER_SITE_OTHER): Payer: Medicare Other

## 2011-06-16 DIAGNOSIS — I2510 Atherosclerotic heart disease of native coronary artery without angina pectoris: Secondary | ICD-10-CM

## 2011-06-16 DIAGNOSIS — R0602 Shortness of breath: Secondary | ICD-10-CM

## 2011-06-21 ENCOUNTER — Encounter: Payer: Self-pay | Admitting: *Deleted

## 2012-01-17 ENCOUNTER — Encounter (HOSPITAL_COMMUNITY): Payer: Self-pay | Admitting: Pharmacy Technician

## 2012-01-17 NOTE — Patient Instructions (Addendum)
Your procedure is scheduled on:  01/24/12  Report to [facility] at     9:00 AM.  Call this number if you have problems the morning of surgery: [phone]   Remember:   Do not eat or drink :After Midnight.    Take these medicines the morning of surgery with A SIP OF WATER: Lisinopril, Metoprolol, Zantac   Do not wear jewelry, make-up or nail polish.  Do not wear lotions, powders, or perfumes. You may wear deodorant.  Do not shave 48 hours prior to surgery.  Do not bring valuables to the hospital.  Contacts, dentures or bridgework may not be worn into surgery.  Patients discharged the day of surgery will not be allowed to drive home.  Name and phone number of your driver:    Please read over the following fact sheets that you were given: Pain Booklet, Surgical Site Infection Prevention, Anesthesia Post-op Instructions and Care and Recovery After Surgery  Cataract Surgery  A cataract is a clouding of the lens of the eye. When a lens becomes cloudy, vision is reduced based on the degree and nature of the clouding. Surgery may be needed to improve vision. Surgery removes the cloudy lens and usually replaces it with a substitute lens (intraocular lens, IOL). LET YOUR EYE DOCTOR KNOW ABOUT:  Allergies to food or medicine.   Medicines taken including herbs, eyedrops, over-the-counter medicines, and creams.   Use of steroids (by mouth or creams).   Previous problems with anesthetics or numbing medicine.   History of bleeding problems or blood clots.   Previous surgery.   Other health problems, including diabetes and kidney problems.   Possibility of pregnancy, if this applies.  RISKS AND COMPLICATIONS  Infection.   Inflammation of the eyeball (endophthalmitis) that can spread to both eyes (sympathetic ophthalmia).   Poor wound healing.   If an IOL is inserted, it can later fall out of proper position. This is very uncommon.   Clouding of the part of your eye that holds an  IOL in place. This is called an "after-cataract." These are uncommon, but easily treated.  BEFORE THE PROCEDURE  Do not eat or drink anything except small amounts of water for 8 to 12 before your surgery, or as directed by your caregiver.   Unless you are told otherwise, continue any eyedrops you have been prescribed.   Talk to your primary caregiver about all other medicines that you take (both prescription and non-prescription). In some cases, you may need to stop or change medicines near the time of your surgery. This is most important if you are taking blood-thinning medicine.Do not stop medicines unless you are told to do so.   Arrange for someone to drive you to and from the procedure.   Do not put contact lenses in either eye on the day of your surgery.  PROCEDURE There is more than one method for safely removing a cataract. Your doctor can explain the differences and help determine which is best for you. Phacoemulsification surgery is the most common form of cataract surgery.  An injection is given behind the eye or eyedrops are given to make this a painless procedure.   A small cut (incision) is made on the edge of the clear, dome-shaped surface that covers the front of the eye (cornea).   A tiny probe is painlessly inserted into the eye. This device gives off ultrasound waves that soften and break up the cloudy center of the lens. This makes it  easier for the cloudy lens to be removed by suction.   An IOL may be implanted.   The normal lens of the eye is covered by a clear capsule. Part of that capsule is intentionally left in the eye to support the IOL.   Your surgeon may or may not use stitches to close the incision.  There are other forms of cataract surgery that require a larger incision and stiches to close the eye. This approach is taken in cases where the doctor feels that the cataract cannot be easily removed using phacoemulsification. AFTER THE PROCEDURE  When an  IOL is implanted, it does not need care. It becomes a permanent part of your eye and cannot be seen or felt.   Your doctor will schedule follow-up exams to check on your progress.   Review your other medicines with your doctor to see which can be resumed after surgery.   Use eyedrops or take medicine as prescribed by your doctor.  Document Released: 02/11/2011 Document Reviewed: 02/08/2011 [facility] Patient Information 2012 [place], [place].  .Cataract Surgery Care After Refer to this sheet in the next few weeks. These instructions provide you with information on caring for yourself after your procedure. Your caregiver may also give you more specific instructions. Your treatment has been planned according to current medical practices, but problems sometimes occur. Call your caregiver if you have any problems or questions after your procedure.  HOME CARE INSTRUCTIONS   Avoid strenuous activities as directed by your caregiver.   Ask your caregiver when you can resume driving.   Use eyedrops or other medicines to help healing and control pressure inside your eye as directed by your caregiver.   Only take over-the-counter or prescription medicines for pain, discomfort, or fever as directed by your caregiver.   Do not to touch or rub your eyes.   You may be instructed to use a protective shield during the first few days and nights after surgery. If not, wear sunglasses to protect your eyes. This is to protect the eye from pressure or from being accidentally bumped.   Keep the area around your eye clean and dry. Avoid swimming or allowing water to hit you directly in the face while showering. Keep soap and shampoo out of your eyes.   Do not bend or lift heavy objects. Bending increases pressure in the eye. You can walk, climb stairs, and do light household chores.   Do not put a contact lens into the eye that had surgery until your caregiver says it is okay to do so.   Ask your doctor when  you can return to work. This will depend on the kind of work that you do. If you work in a dusty environment, you may be advised to wear protective eyewear for a period of time.   Ask your caregiver when it will be safe to engage in sexual activity.   Continue with your regular eye exams as directed by your caregiver.  What to expect:  It is normal to feel itching and mild discomfort for a few days after cataract surgery. Some fluid discharge is also common, and your eye may be sensitive to light and touch.   After 1 to 2 days, even moderate discomfort should disappear. In most cases, healing will take about 6 weeks.   If you received an intraocular lens (IOL), you may notice that colors are very bright or have a blue tinge. Also, if you have been in bright sunlight, everything  may appear reddish for a few hours. If you see these color tinges, it is because your lens is clear and no longer cloudy. Within a few months after receiving an IOL, these extra colors should go away. When you have healed, you will probably need new glasses.  SEEK MEDICAL CARE IF:   You have increased bruising around your eye.   You have discomfort not helped by medicine.  SEEK IMMEDIATE MEDICAL CARE IF:   You have a fever.   You have a worsening or sudden vision loss.   You have redness, swelling, or increasing pain in the eye.   You have a thick discharge from the eye that had surgery.  MAKE SURE YOU:  Understand these instructions.   Will watch your condition.   Will get help right away if you are not doing well or get worse.  Document Released: 09/11/2004 Document Revised: 02/11/2011 Document Reviewed: 10/16/2010 [facility] Patient Information 2012 [place], [place].

## 2012-01-18 ENCOUNTER — Encounter (HOSPITAL_COMMUNITY): Payer: Self-pay

## 2012-01-18 ENCOUNTER — Encounter (HOSPITAL_COMMUNITY)
Admission: RE | Admit: 2012-01-18 | Discharge: 2012-01-18 | Disposition: A | Discharge status: Home / Self Care | Payer: Medicare Other | Source: Ambulatory Visit | Attending: Ophthalmology | Admitting: Ophthalmology

## 2012-01-18 HISTORY — DX: Acute myocardial infarction, unspecified: I21.9

## 2012-01-18 HISTORY — DX: Unspecified osteoarthritis, unspecified site: M19.90

## 2012-01-18 HISTORY — DX: Gastro-esophageal reflux disease without esophagitis: K21.9

## 2012-01-18 LAB — HEMOGLOBIN AND HEMATOCRIT, BLOOD: HCT: 43 % (ref 39.0–52.0)

## 2012-01-18 LAB — BASIC METABOLIC PANEL
CO2: 27 mEq/L (ref 19–32)
Chloride: 98 mEq/L (ref 96–112)
Creatinine, Ser: 0.74 mg/dL (ref 0.50–1.35)
Potassium: 4.7 mEq/L (ref 3.5–5.1)

## 2012-01-18 MED ORDER — CYCLOPENTOLATE-PHENYLEPHRINE 0.2-1 % OP SOLN
OPHTHALMIC | Status: AC
  Filled 2012-01-18: qty 2

## 2012-01-18 NOTE — Progress Notes (Signed)
01/18/12 1254  OBSTRUCTIVE SLEEP APNEA  Have you ever been diagnosed with sleep apnea through a sleep study? No  Do you snore loudly (loud enough to be heard through closed doors)?  0  Do you often feel tired, fatigued, or sleepy during the daytime? 0  Has anyone observed you stop breathing during your sleep? 0  Do you have, or are you being treated for high blood pressure? 1  BMI more than 35 kg/m2? 0  Age over 67 years old? 1  Neck circumference greater than 40 cm/18 inches? 1  Gender: 1  Obstructive Sleep Apnea Score 4   Score 4 or greater  Results sent to PCP

## 2012-01-24 ENCOUNTER — Ambulatory Visit (HOSPITAL_COMMUNITY): Payer: Medicare Other | Admitting: Anesthesiology

## 2012-01-24 ENCOUNTER — Encounter (HOSPITAL_COMMUNITY): Payer: Self-pay | Admitting: Anesthesiology

## 2012-01-24 ENCOUNTER — Encounter (HOSPITAL_COMMUNITY): Payer: Self-pay

## 2012-01-24 ENCOUNTER — Encounter (HOSPITAL_COMMUNITY)
Admission: RE | Disposition: A | Discharge status: Home / Self Care | Payer: Self-pay | Source: Ambulatory Visit | Attending: Ophthalmology

## 2012-01-24 ENCOUNTER — Ambulatory Visit (HOSPITAL_COMMUNITY)
Admission: RE | Admit: 2012-01-24 | Discharge: 2012-01-24 | Disposition: A | Discharge status: Home / Self Care | Payer: Medicare Other | Source: Ambulatory Visit | Attending: Ophthalmology | Admitting: Ophthalmology

## 2012-01-24 DIAGNOSIS — J449 Chronic obstructive pulmonary disease, unspecified: Secondary | ICD-10-CM | POA: Insufficient documentation

## 2012-01-24 DIAGNOSIS — H2510 Age-related nuclear cataract, unspecified eye: Secondary | ICD-10-CM | POA: Insufficient documentation

## 2012-01-24 DIAGNOSIS — I2510 Atherosclerotic heart disease of native coronary artery without angina pectoris: Secondary | ICD-10-CM | POA: Insufficient documentation

## 2012-01-24 DIAGNOSIS — J4489 Other specified chronic obstructive pulmonary disease: Secondary | ICD-10-CM | POA: Insufficient documentation

## 2012-01-24 DIAGNOSIS — I10 Essential (primary) hypertension: Secondary | ICD-10-CM | POA: Insufficient documentation

## 2012-01-24 DIAGNOSIS — E119 Type 2 diabetes mellitus without complications: Secondary | ICD-10-CM | POA: Insufficient documentation

## 2012-01-24 DIAGNOSIS — Z01812 Encounter for preprocedural laboratory examination: Secondary | ICD-10-CM | POA: Insufficient documentation

## 2012-01-24 HISTORY — PX: CATARACT EXTRACTION W/PHACO: SHX586

## 2012-01-24 SURGERY — PHACOEMULSIFICATION, CATARACT, WITH IOL INSERTION
Anesthesia: Monitor Anesthesia Care | Site: Eye | Laterality: Left | Wound class: Clean

## 2012-01-24 MED ORDER — GATIFLOXACIN 0.5 % OP SOLN OPTIME - NO CHARGE
OPHTHALMIC | Status: DC | PRN
  Administered 2012-01-24: 1 [drp] via OPHTHALMIC

## 2012-01-24 MED ORDER — BSS IO SOLN
INTRAOCULAR | Status: DC | PRN
  Administered 2012-01-24: 15 mL via INTRAOCULAR

## 2012-01-24 MED ORDER — GATIFLOXACIN 0.5 % OP SOLN OPTIME - NO CHARGE
1.0000 [drp] | Freq: Once | OPHTHALMIC | Status: AC
  Administered 2012-01-24: 1 [drp] via OPHTHALMIC
  Filled 2012-01-24: qty 2.5

## 2012-01-24 MED ORDER — MIDAZOLAM HCL 2 MG/2ML IJ SOLN
1.0000 mg | INTRAMUSCULAR | Status: DC | PRN
  Administered 2012-01-24: 2 mg via INTRAVENOUS

## 2012-01-24 MED ORDER — LIDOCAINE 3.5 % OP GEL OPTIME - NO CHARGE
OPHTHALMIC | Status: DC | PRN
  Administered 2012-01-24: 2 [drp] via OPHTHALMIC

## 2012-01-24 MED ORDER — KETOROLAC TROMETHAMINE 0.4 % OP SOLN - NO CHARGE
1.0000 [drp] | Freq: Once | OPHTHALMIC | Status: AC
  Administered 2012-01-24: 1 [drp] via OPHTHALMIC
  Filled 2012-01-24: qty 5

## 2012-01-24 MED ORDER — EPINEPHRINE HCL 1 MG/ML IJ SOLN
INTRAMUSCULAR | Status: AC
  Filled 2012-01-24: qty 1

## 2012-01-24 MED ORDER — MIDAZOLAM HCL 2 MG/2ML IJ SOLN
INTRAMUSCULAR | Status: AC
  Filled 2012-01-24: qty 2

## 2012-01-24 MED ORDER — CYCLOPENTOLATE-PHENYLEPHRINE 0.2-1 % OP SOLN
1.0000 [drp] | Freq: Once | OPHTHALMIC | Status: AC
  Administered 2012-01-24: 1 [drp] via OPHTHALMIC

## 2012-01-24 MED ORDER — TETRACAINE HCL 0.5 % OP SOLN
OPHTHALMIC | Status: AC
  Filled 2012-01-24: qty 2

## 2012-01-24 MED ORDER — EPINEPHRINE HCL 1 MG/ML IJ SOLN
INTRAOCULAR | Status: DC | PRN
  Administered 2012-01-24: 10:00:00

## 2012-01-24 MED ORDER — LACTATED RINGERS IV SOLN
INTRAVENOUS | Status: DC
  Administered 2012-01-24 (×2): via INTRAVENOUS

## 2012-01-24 MED ORDER — TETRACAINE 0.5 % OP SOLN OPTIME - NO CHARGE
OPHTHALMIC | Status: DC | PRN
  Administered 2012-01-24: 2 [drp] via OPHTHALMIC

## 2012-01-24 MED ORDER — MOXIFLOXACIN HCL 0.5 % OP SOLN - NO CHARGE
1.0000 [drp] | Freq: Once | OPHTHALMIC | Status: DC
  Filled 2012-01-24: qty 3

## 2012-01-24 MED ORDER — NA HYALUR & NA CHOND-NA HYALUR 0.55-0.5 ML IO KIT
PACK | INTRAOCULAR | Status: DC | PRN
  Administered 2012-01-24: 1 via OPHTHALMIC

## 2012-01-24 MED ORDER — LIDOCAINE HCL 3.5 % OP GEL
OPHTHALMIC | Status: AC
  Filled 2012-01-24: qty 5

## 2012-01-24 SURGICAL SUPPLY — 27 items
CAPSULAR TENSION RING-AMO (OPHTHALMIC RELATED) IMPLANT
CLOTH BEACON ORANGE TIMEOUT ST (SAFETY) ×2 IMPLANT
GLOVE BIO SURGEON STRL SZ7.5 (GLOVE) IMPLANT
GLOVE BIOGEL M 6.5 STRL (GLOVE) IMPLANT
GLOVE BIOGEL PI IND STRL 6.5 (GLOVE) ×1 IMPLANT
GLOVE BIOGEL PI IND STRL 7.0 (GLOVE) IMPLANT
GLOVE BIOGEL PI INDICATOR 6.5 (GLOVE) ×1
GLOVE BIOGEL PI INDICATOR 7.0 (GLOVE)
GLOVE ECLIPSE 6.5 STRL STRAW (GLOVE) IMPLANT
GLOVE ECLIPSE 7.5 STRL STRAW (GLOVE) IMPLANT
GLOVE EXAM NITRILE LRG STRL (GLOVE) IMPLANT
GLOVE EXAM NITRILE MD LF STRL (GLOVE) ×2 IMPLANT
GLOVE SKINSENSE NS SZ6.5 (GLOVE)
GLOVE SKINSENSE NS SZ7.0 (GLOVE)
GLOVE SKINSENSE STRL SZ6.5 (GLOVE) IMPLANT
GLOVE SKINSENSE STRL SZ7.0 (GLOVE) IMPLANT
INST SET CATARACT ~~LOC~~ (KITS) ×2 IMPLANT
KIT VITRECTOMY (OPHTHALMIC RELATED) IMPLANT
PAD ARMBOARD 7.5X6 YLW CONV (MISCELLANEOUS) ×2 IMPLANT
PROC W NO LENS (INTRAOCULAR LENS)
PROC W SPEC LENS (INTRAOCULAR LENS)
PROCESS W NO LENS (INTRAOCULAR LENS) IMPLANT
PROCESS W SPEC LENS (INTRAOCULAR LENS) IMPLANT
RING MALYGIN (MISCELLANEOUS) IMPLANT
SIGHTPATH CAT PROC W REG LENS (Ophthalmic Related) ×2 IMPLANT
VISCOELASTIC ADDITIONAL (OPHTHALMIC RELATED) IMPLANT
WATER STERILE IRR 250ML POUR (IV SOLUTION) ×2 IMPLANT

## 2012-01-24 NOTE — Op Note (Signed)
See scanned op note dated today  

## 2012-01-24 NOTE — H&P (Signed)
I have reviewed the pre printed H&P, the patient was re-examined, and I have identified no significant interval changes in the patient's medical condition.  There is no change in the plan of care since the history and physical of record. 

## 2012-01-24 NOTE — Anesthesia Postprocedure Evaluation (Signed)
  Anesthesia Post-op Note  Patient: Jason [name]  Procedure(s) Performed: Procedure(s) (LRB) with comments: CATARACT EXTRACTION PHACO AND INTRAOCULAR LENS PLACEMENT (IOC) (Left) - CDE:  26.77  Patient Location: PACU and Short Stay  Anesthesia Type:MAC  Level of Consciousness: awake, alert , oriented and patient cooperative  Airway and Oxygen Therapy: Patient Spontanous Breathing  Post-op Pain: none  Post-op Assessment: Post-op Vital signs reviewed, Patient's Cardiovascular Status Stable, Respiratory Function Stable, Patent Airway, No signs of Nausea or vomiting and Pain level controlled  Post-op Vital Signs: Reviewed and stable  Complications: No apparent anesthesia complications

## 2012-01-24 NOTE — Anesthesia Preprocedure Evaluation (Signed)
Anesthesia Evaluation  Patient identified by MRN, date of birth, ID band Patient awake    Reviewed: Allergy & Precautions, H&P , NPO status , Patient's Chart, lab work & pertinent test results, reviewed documented beta blocker date and time   Airway Mallampati: III    Mouth opening: Limited Mouth Opening  Dental  (+) Edentulous Upper and Edentulous Lower   Pulmonary COPDformer smoker,  breath sounds clear to auscultation        Cardiovascular hypertension, - angina+ CAD, + Past MI and + Cardiac Stents Rhythm:Regular Rate:Normal     Neuro/Psych    GI/Hepatic GERD-  Medicated,  Endo/Other  diabetes, Well Controlled, Type 2  Renal/GU Renal disease     Musculoskeletal   Abdominal   Peds  Hematology   Anesthesia Other Findings   Reproductive/Obstetrics                           Anesthesia Physical Anesthesia Plan  ASA: III  Anesthesia Plan: MAC   Post-op Pain Management:    Induction: Intravenous  Airway Management Planned: Nasal Cannula  Additional Equipment:   Intra-op Plan:   Post-operative Plan:   Informed Consent: I have reviewed the patients History and Physical, chart, labs and discussed the procedure including the risks, benefits and alternatives for the proposed anesthesia with the patient or authorized representative who has indicated his/her understanding and acceptance.     Plan Discussed with:   Anesthesia Plan Comments:         Anesthesia Quick Evaluation

## 2012-01-24 NOTE — Transfer of Care (Signed)
Immediate Anesthesia Transfer of Care Note  Patient: Jason [name]  Procedure(s) Performed: Procedure(s) (LRB) with comments: CATARACT EXTRACTION PHACO AND INTRAOCULAR LENS PLACEMENT (IOC) (Left) - CDE:  26.77  Patient Location: PACU and Short Stay  Anesthesia Type:MAC  Level of Consciousness: awake, alert , oriented and patient cooperative  Airway & Oxygen Therapy: Patient Spontanous Breathing  Post-op Assessment: Report given to PACU RN and Post -op Vital signs reviewed and stable  Post vital signs: Reviewed and stable  Complications: No apparent anesthesia complications

## 2012-01-24 NOTE — Brief Op Note (Signed)
01/24/2012  11:22 AM  PATIENT:  Jason [name]  66 y.o. male  PRE-OPERATIVE DIAGNOSIS:  nuclear cataract left eye  POST-OPERATIVE DIAGNOSIS:  nuclear cataract left eye  PROCEDURE:  Procedure(s): CATARACT EXTRACTION PHACO AND INTRAOCULAR LENS PLACEMENT (IOC)  SURGEON:  Surgeon(s): Susa Simmonds, MD  ASSISTANTS: Marya Landry, CST   ANESTHESIA STAFF: Despina Hidden, CRNA - CRNA Laurene Footman, MD - Anesthesiologist  ANESTHESIA:   topical and MAC  REQUESTED LENS POWER: 23.5  LENS IMPLANT INFORMATION:  [employer] [id number] exp 06/2015  S/n [id number]  CUMULATIVE DISSIPATED ENERGY:26.77  INDICATIONS:see H&P  OP FINDINGS:dense NS  COMPLICATIONS:None  DICTATION #: see scanned op note  PLAN OF CARE: see H&P  PATIENT DISPOSITION:  Short Stay

## 2012-01-25 ENCOUNTER — Encounter (HOSPITAL_COMMUNITY): Payer: Self-pay | Admitting: Ophthalmology

## 2012-03-23 ENCOUNTER — Other Ambulatory Visit: Payer: Self-pay | Admitting: Cardiology

## 2013-02-14 ENCOUNTER — Encounter: Payer: Self-pay | Admitting: Cardiology

## 2013-02-14 ENCOUNTER — Ambulatory Visit (INDEPENDENT_AMBULATORY_CARE_PROVIDER_SITE_OTHER): Payer: Medicare Other | Admitting: Cardiology

## 2013-02-14 VITALS — BP 108/69 | HR 60 | Ht 69.0 in | Wt 228.0 lb

## 2013-02-14 DIAGNOSIS — I2510 Atherosclerotic heart disease of native coronary artery without angina pectoris: Secondary | ICD-10-CM

## 2013-02-14 DIAGNOSIS — I10 Essential (primary) hypertension: Secondary | ICD-10-CM

## 2013-02-14 MED ORDER — ASPIRIN EC 81 MG PO TBEC: 81.0000 mg | DELAYED_RELEASE_TABLET | Freq: Every day | ORAL | Status: DC

## 2013-02-14 NOTE — Patient Instructions (Signed)
   Decrease Aspirin to 81mg daily  Continue all other medications.   Your physician wants you to follow up in:  1 year.  You will receive a reminder letter in the mail one-two months in advance.  If you don't receive a letter, please call our office to schedule the follow up appointment   

## 2013-02-14 NOTE — Progress Notes (Signed)
Clinical Summary Mr. Soloway is a 67 y.o.male former patient of Dr Andee Lineman, this is our first visit together. He was seen for the following medical problems.  1. CAD - prior DES to LAD 04/2008 in the setting of NSTEMI - 06/2011 echo LVEF 55-60%, no wall motion abnormalities.   -denies any chest pain, no SOB or DOE. [place] track twice a week without limitations -compliant with meds: ASA 325 mg, lisinopril, metoprolol, simva 80 mg  2. HTN - does not check at home - compliant with meds  3. HL - compliant with statin, has been on zocor for several years without side effects. Followed by Dr Sherril Croon, no recent panel in our system - reports levels checked just recently and were at goal  Past Medical History  Diagnosis Date  . Hypertension   . CAD (coronary artery disease)     native vessel, status post drug-eluting stent to the LAD.  . Diabetes mellitus     non insulin dependant  . Nephrolithiasis   . Chronic back pain   . Microalbuminuria     in the setting of diabetes mellitus  . GERD (gastroesophageal reflux disease)   . Arthritis   . Myocardial infarction 2010     No Known Allergies   Current Outpatient Prescriptions  Medication Sig Dispense Refill  . aspirin 325 MG tablet Take 325 mg by mouth daily.        Marland Kitchen aspirin EC 325 MG tablet TAKE ONE TABLET BY MOUTH DAILY  30 tablet  PRN  . insulin NPH-insulin regular (NOVOLIN 70/30) (70-30) 100 UNIT/ML injection Inject 60 Units into the skin 2 (two) times daily with a meal.       . metFORMIN (GLUCOPHAGE) 500 MG tablet Take 1,000 mg by mouth 2 (two) times daily with a meal.       . metoprolol tartrate (LOPRESSOR) 25 MG tablet Take 25 mg by mouth 2 (two) times daily.       . nitroGLYCERIN (NITROSTAT) 0.4 MG SL tablet Place 0.4 mg under the tongue every 5 (five) minutes as needed. Chest pain.      . ranitidine (ZANTAC) 150 MG capsule Take 150 mg by mouth 2 (two) times daily.        . simvastatin (ZOCOR) 80 MG tablet Take  80 mg by mouth at bedtime.         No current facility-administered medications for this visit.     Past Surgical History  Procedure Laterality Date  . Rotator cuff repair    . Hernia repair    . Cardiac catheterization      with stent  . Tonsillectomy    . Cataract extraction w/phaco  01/24/2012    Procedure: CATARACT EXTRACTION PHACO AND INTRAOCULAR LENS PLACEMENT (IOC);  Surgeon: Susa Simmonds, MD;  Location: [facility];  Service: Ophthalmology;  Laterality: Left;  CDE:  26.77     No Known Allergies    No family history on file.   Social History Mr. Hopkin reports that he quit smoking about 4 years ago. His smoking use included Cigarettes. He has a 80 pack-year smoking history. He has never used smokeless tobacco. Mr. Staszak reports that he does not drink alcohol.   Review of Systems CONSTITUTIONAL: No weight loss, fever, chills, weakness or fatigue.  HEENT: Eyes: No visual loss, blurred vision, double vision or yellow sclerae.No hearing loss, sneezing, congestion, runny nose or sore throat.  SKIN: No rash or itching.  CARDIOVASCULAR:  per HPI RESPIRATORY: No shortness of breath, cough or sputum.  GASTROINTESTINAL: No anorexia, nausea, vomiting or diarrhea. No abdominal pain or blood.  GENITOURINARY: No burning on urination, no polyuria NEUROLOGICAL: No headache, dizziness, syncope, paralysis, ataxia, numbness or tingling in the extremities. No change in bowel or bladder control.  MUSCULOSKELETAL: No muscle, back pain, joint pain or stiffness.  LYMPHATICS: No enlarged nodes. No history of splenectomy.  PSYCHIATRIC: No history of depression or anxiety.  ENDOCRINOLOGIC: No reports of sweating, cold or heat intolerance. No polyuria or polydipsia.  Marland Kitchen   Physical Examination p 60 bp 108/69 Wt 228 lbs BMI 37 Gen: resting comfortably, no acute distress HEENT: no scleral icterus, pupils equal round and reactive, no palptable cervical adenopathy,  CV: RRR, no m/r/g,no JVD,  no carotid bruits Resp: Clear to auscultation bilaterally GI: abdomen is soft, non-tender, non-distended, normal bowel sounds, no hepatosplenomegaly MSK: extremities are warm, no edema.  Skin: warm, no rash Neuro:  no focal deficits Psych: appropriate affect   Diagnostic Studies 04/2008 Cath Left main had a mid 20% stenosis.  LAD was a long vessel wrapping the apex, gave off 2 small diagonals.  There was a 30% lesion in the proximal LAD and the proximal to mid LAD.  Just after the takeoff of the small first diagonal, there was a high-  grade 99% calcified lesion. In the distal LAD, there was some mild  plaquing of about 30%.  Left circumflex gave off a tiny OM-1, tiny OM-2, large OM-3, and 2  posterolaterals. There was a 40% lesion in the mid AV groove circumflex  with diffuse minor plaquing throughout the rest of the circumflex  system. There was a 30% lesion in the proximal portion of the OM-3.  Right coronary artery was a very large dominant system, gave off a PL  and 2 posterolaterals. There was diffuse 30% stenosis throughout the  proximal midsection and a 30% lesion more distally by the  posterolaterals.  Left ventriculogram done in the RAO position showed an EF of 60%. No  regional wall motion abnormalities.  Abdominal aortogram showed patent renal arteries bilaterally. The  abdominal aorta was widely patent with no aneurysmal dilatation.  ASSESSMENT:  1. Coronary artery disease with high-grade lesion in the left anterior  descending. Nonobstructive disease in the left circumflex and  right coronary arteries.  2. Normal left ventricular function.  3. Normal renal arteries and normal abdominal aorta.  PLAN: He will need percutaneous intervention on the LAD later today  with Dr. Excell Seltzer. He will also need to aggressive risk factor  modification including smoking cessation.  FINAL CONCLUSIONS: Successful stenting of the proximal LAD using a  single Xience drug-eluting stent.   RECOMMENDATIONS: Recommend Plavix and aspirin in combination for at  least 12 months. Plavix will be given twice daily for the first week in  the setting of the patient's non-ST elevation MI.  06/2011 Echo LVEF 55-60%, mild LVH, no WMAs, grade I diastolic dysfunction   02/14/13 Clinic EKG Sinus rhythm rate 68, normal axis, low voltage limb leads, occasional PACs, no ischemic changes Assessment and Plan  1. CAD - no current symptoms - continue secondary prevention and risk factor modfication - change ASA to 81 mg daily  2. HTN - at goal, continue current meds  3. HL - followed by PCP, no recent panel in our system - on simva 80 mg daily, reports has been on for a long time without any significant side effects - statin therapy per PCP  F/u 1 year  Antoine Poche, M.D., F.A.C.C.

## 2013-02-23 ENCOUNTER — Ambulatory Visit: Payer: Medicare Other | Admitting: Cardiology

## 2014-02-19 ENCOUNTER — Ambulatory Visit (INDEPENDENT_AMBULATORY_CARE_PROVIDER_SITE_OTHER): Payer: Medicare Other | Admitting: Cardiology

## 2014-02-19 ENCOUNTER — Encounter: Payer: Self-pay | Admitting: Cardiology

## 2014-02-19 VITALS — BP 116/78 | HR 56 | Ht 69.0 in | Wt 230.0 lb

## 2014-02-19 DIAGNOSIS — I10 Essential (primary) hypertension: Secondary | ICD-10-CM

## 2014-02-19 DIAGNOSIS — I2510 Atherosclerotic heart disease of native coronary artery without angina pectoris: Secondary | ICD-10-CM

## 2014-02-19 DIAGNOSIS — E785 Hyperlipidemia, unspecified: Secondary | ICD-10-CM

## 2014-02-19 MED ORDER — ASPIRIN EC 81 MG PO TBEC: 81.0000 mg | DELAYED_RELEASE_TABLET | Freq: Every day | ORAL | Status: DC

## 2014-02-19 NOTE — Progress Notes (Signed)
Clinical Summary Mr. Porcaro is a 68 y.o.male seen today for follow up of the following medical problems.  1. CAD - prior DES to LAD 04/2008 in the setting of NSTEMI - 06/2011 echo LVEF 55-60%, no wall motion abnormalities.  - denies any recent chest pain. No SOB or DOE. Walks regularly, 3 miles a few times a week without any significant symptoms  -compliant with meds   2. HTN - does not check at home - compliant with meds  3. HL - compliant with statin, has been on zocor for several years without side effects. Followed by Dr Woody Seller, no recent panel in our system - reports levels checked just recently and were at goal    Past Medical History  Diagnosis Date  . Hypertension   . CAD (coronary artery disease)     native vessel, status post drug-eluting stent to the LAD.  . Diabetes mellitus     non insulin dependant  . Nephrolithiasis   . Chronic back pain   . Microalbuminuria     in the setting of diabetes mellitus  . GERD (gastroesophageal reflux disease)   . Arthritis   . Myocardial infarction 2010     No Known Allergies   Current Outpatient Prescriptions  Medication Sig Dispense Refill  . aspirin EC 81 MG tablet Take 1 tablet (81 mg total) by mouth daily.    . insulin NPH-insulin regular (NOVOLIN 70/30) (70-30) 100 UNIT/ML injection Inject 68 Units into the skin 2 (two) times daily with a meal.     . lisinopril (PRINIVIL,ZESTRIL) 10 MG tablet Take 1 tablet by mouth daily.    . metFORMIN (GLUCOPHAGE) 500 MG tablet Take 1,000 mg by mouth 2 (two) times daily with a meal.     . metoprolol tartrate (LOPRESSOR) 25 MG tablet Take 25 mg by mouth daily.     . nitroGLYCERIN (NITROSTAT) 0.4 MG SL tablet Place 0.4 mg under the tongue every 5 (five) minutes as needed. Chest pain.    . ranitidine (ZANTAC) 150 MG capsule Take 150 mg by mouth 2 (two) times daily.      . simvastatin (ZOCOR) 80 MG tablet Take 80 mg by mouth at bedtime.       No current facility-administered  medications for this visit.     Past Surgical History  Procedure Laterality Date  . Rotator cuff repair    . Hernia repair    . Tonsillectomy    . Cataract extraction w/phaco  01/24/2012    Procedure: CATARACT EXTRACTION PHACO AND INTRAOCULAR LENS PLACEMENT (IOC);  Surgeon: Williams Che, MD;  Location: [facility];  Service: Ophthalmology;  Laterality: Left;  CDE:  26.77  . Cardiac catheterization      with stent     No Known Allergies    No family history on file.   Social History Mr. Vanosdol reports that he quit smoking about 5 years ago. His smoking use included Cigarettes. He has a 80 pack-year smoking history. He has never used smokeless tobacco. Mr. Sheerin reports that he does not drink alcohol.   Review of Systems CONSTITUTIONAL: No weight loss, fever, chills, weakness or fatigue.  HEENT: Eyes: No visual loss, blurred vision, double vision or yellow sclerae.No hearing loss, sneezing, congestion, runny nose or sore throat.  SKIN: No rash or itching.  CARDIOVASCULAR: per HPI RESPIRATORY: No shortness of breath, cough or sputum.  GASTROINTESTINAL: No anorexia, nausea, vomiting or diarrhea. No abdominal pain or blood.  GENITOURINARY: No burning  on urination, no polyuria NEUROLOGICAL: No headache, dizziness, syncope, paralysis, ataxia, numbness or tingling in the extremities. No change in bowel or bladder control.  MUSCULOSKELETAL: No muscle, back pain, joint pain or stiffness.  LYMPHATICS: No enlarged nodes. No history of splenectomy.  PSYCHIATRIC: No history of depression or anxiety.  ENDOCRINOLOGIC: No reports of sweating, cold or heat intolerance. No polyuria or polydipsia.  Marland Kitchen   Physical Examination p 56 bp 116/78 Wt 230 lbs BMI 34 Gen: resting comfortably, no acute distress HEENT: no scleral icterus, pupils equal round and reactive, no palptable cervical adenopathy,  CV: RRR, no m/r/g, no JVD, no carotid bruits Resp: Clear to auscultation bilaterally GI:  abdomen is soft, non-tender, non-distended, normal bowel sounds, no hepatosplenomegaly MSK: extremities are warm, no edema.  Skin: warm, no rash Neuro:  no focal deficits Psych: appropriate affect   Diagnostic Studies 04/2008 Cath Left main had a mid 20% stenosis.  LAD was a long vessel wrapping the apex, gave off 2 small diagonals.  There was a 30% lesion in the proximal LAD and the proximal to mid LAD.  Just after the takeoff of the small first diagonal, there was a high-  grade 99% calcified lesion. In the distal LAD, there was some mild  plaquing of about 30%.  Left circumflex gave off a tiny OM-1, tiny OM-2, large OM-3, and 2  posterolaterals. There was a 40% lesion in the mid AV groove circumflex  with diffuse minor plaquing throughout the rest of the circumflex  system. There was a 30% lesion in the proximal portion of the OM-3.  Right coronary artery was a very large dominant system, gave off a PL  and 2 posterolaterals. There was diffuse 30% stenosis throughout the  proximal midsection and a 30% lesion more distally by the  posterolaterals.  Left ventriculogram done in the RAO position showed an EF of 60%. No  regional wall motion abnormalities.  Abdominal aortogram showed patent renal arteries bilaterally. The  abdominal aorta was widely patent with no aneurysmal dilatation.  ASSESSMENT:  1. Coronary artery disease with high-grade lesion in the left anterior  descending. Nonobstructive disease in the left circumflex and  right coronary arteries.  2. Normal left ventricular function.  3. Normal renal arteries and normal abdominal aorta.  PLAN: He will need percutaneous intervention on the LAD later today  with Dr. Burt Knack. He will also need to aggressive risk factor  modification including smoking cessation.  FINAL CONCLUSIONS: Successful stenting of the proximal LAD using a  single Xience drug-eluting stent.  RECOMMENDATIONS: Recommend Plavix  and aspirin in combination for at  least 12 months. Plavix will be given twice daily for the first week in  the setting of the patient's non-ST elevation MI.  06/2011 Echo LVEF 55-60%, mild LVH, no WMAs, grade I diastolic dysfunction    Assessment and Plan  1. CAD - no current symptoms - continue secondary prevention and risk factor modfication - change ASA to 81 mg daily  2. HTN - at goal, continue current meds  3. HL - followed by PCP, no recent panel in our system - on simva 80 mg daily, reports has been on for a long time without any significant side effects - statin therapy per PCP. Consider high dose statin in setting of known CAD (atorva 80 or crestor 20mg )   F/u 1 year   Arnoldo Lenis, M.D.

## 2014-02-19 NOTE — Patient Instructions (Signed)
   Decrease Asprin to 81mg  daily  Your physician wants you to follow-up in: 1 year.  You will receive a reminder letter in the mail two months in advance. If you don't receive a letter, please call our office to schedule the follow-up appointment. Continue all other medications

## 2015-02-25 ENCOUNTER — Encounter: Payer: Self-pay | Admitting: Cardiology

## 2015-02-25 ENCOUNTER — Encounter: Payer: Self-pay | Admitting: *Deleted

## 2015-02-25 ENCOUNTER — Ambulatory Visit (INDEPENDENT_AMBULATORY_CARE_PROVIDER_SITE_OTHER): Payer: Medicare Other | Admitting: Cardiology

## 2015-02-25 VITALS — BP 99/64 | HR 62 | Ht 69.0 in | Wt 231.0 lb

## 2015-02-25 DIAGNOSIS — I10 Essential (primary) hypertension: Secondary | ICD-10-CM | POA: Diagnosis not present

## 2015-02-25 DIAGNOSIS — I2510 Atherosclerotic heart disease of native coronary artery without angina pectoris: Secondary | ICD-10-CM

## 2015-02-25 DIAGNOSIS — E785 Hyperlipidemia, unspecified: Secondary | ICD-10-CM | POA: Diagnosis not present

## 2015-02-25 DIAGNOSIS — I714 Abdominal aortic aneurysm, without rupture, unspecified: Secondary | ICD-10-CM

## 2015-02-25 DIAGNOSIS — J40 Bronchitis, not specified as acute or chronic: Secondary | ICD-10-CM

## 2015-02-25 MED ORDER — METOPROLOL TARTRATE 25 MG PO TABS: 12.5000 mg | ORAL_TABLET | Freq: Two times a day (BID) | ORAL | Status: DC

## 2015-02-25 MED ORDER — AZITHROMYCIN 250 MG PO TABS: ORAL_TABLET | ORAL | Status: DC

## 2015-02-25 MED ORDER — ATORVASTATIN CALCIUM 80 MG PO TABS: 80.0000 mg | ORAL_TABLET | Freq: Every day | ORAL | Status: DC

## 2015-02-25 NOTE — Patient Instructions (Addendum)
Your physician wants you to follow-up in: [place] DR. BRANCH You will receive a reminder letter in the mail two months in advance. If you don't receive a letter, please call our office to schedule the follow-up appointment.  Your physician has recommended you make the following change in your medication:   DECREASE ASPRIN 81 MG DAILY  DECREASE METOPROLOL 12.5 MG TWICE DAILY  STOP ZOCOR  START LIPITOR 80 MG DAILY  START Z PAK 500 MG TODAY (2 TABS) THEN TAKE 250 MG (1 TAB) DAILY FOR 4 DAYS.   Your physician has requested that you have an abdominal aorta duplex. During this test, an ultrasound is used to evaluate the aorta. Allow 30 minutes for this exam. Do not eat after midnight the day before and avoid carbonated beverages  Thank you for choosing [facility]!!

## 2015-02-25 NOTE — Progress Notes (Signed)
Patient ID: SAMUAL HARI, male   DOB: [birth year], 69 y.o.   MRN: [id number]     Clinical Summary Mr. Fontenette is a 69 y.o.male seen today for follow up of the following medical problems.   1. CAD - prior DES to LAD 04/2008 in the setting of NSTEMI - 06/2011 echo LVEF 55-60%, no wall motion abnormalities.   - denies any chest pain. No SOB or DOE.  - compliant with meds  2. HTN - does not check at home  3. HL - compliant with static  4. Cough - ongoing x 4 weeks that is productive. No fevers or chills.   5. AAA - 2010 [place] was 2.7 cm, has not had repeat US since then. He does have a prior tobacco history   Past Medical History  Diagnosis Date  . Hypertension   . CAD (coronary artery disease)     native vessel, status post drug-eluting stent to the LAD.  . Diabetes mellitus     non insulin dependant  . Nephrolithiasis   . Chronic back pain   . Microalbuminuria     in the setting of diabetes mellitus  . GERD (gastroesophageal reflux disease)   . Arthritis   . Myocardial infarction ([place]) 2010     No Known Allergies   Current Outpatient Prescriptions  Medication Sig Dispense Refill  . aspirin EC 81 MG tablet Take 1 tablet (81 mg total) by mouth daily.    . insulin NPH-insulin regular (NOVOLIN 70/30) (70-30) 100 UNIT/ML injection Inject 68 Units into the skin 2 (two) times daily with a meal.     . lisinopril (PRINIVIL,ZESTRIL) 10 MG tablet Take 1 tablet by mouth daily.    . metFORMIN (GLUCOPHAGE) 500 MG tablet Take 1,000 mg by mouth 2 (two) times daily with a meal.     . metoprolol tartrate (LOPRESSOR) 25 MG tablet Take 25 mg by mouth daily.     . nitroGLYCERIN (NITROSTAT) 0.4 MG SL tablet Place 0.4 mg under the tongue every 5 (five) minutes as needed. Chest pain.    . ranitidine (ZANTAC) 150 MG capsule Take 150 mg by mouth 2 (two) times daily.      . simvastatin (ZOCOR) 80 MG tablet Take 80 mg by mouth at bedtime.       No current facility-administered medications  for this visit.     Past Surgical History  Procedure Laterality Date  . Rotator cuff repair    . Hernia repair    . Tonsillectomy    . Cataract extraction w/phaco  01/24/2012    Procedure: CATARACT EXTRACTION PHACO AND INTRAOCULAR LENS PLACEMENT (IOC);  Surgeon: Williams Che, MD;  Location: [facility];  Service: Ophthalmology;  Laterality: Left;  CDE:  26.77  . Cardiac catheterization      with stent     No Known Allergies    No family history on file.   Social History Mr. Ruter reports that he quit smoking about 6 years ago. His smoking use included Cigarettes. He started smoking about 46 years ago. He has a 80 pack-year smoking history. He has never used smokeless tobacco. Mr. Mollinedo reports that he does not drink alcohol.   Review of Systems CONSTITUTIONAL: No weight loss, fever, chills, weakness or fatigue.  HEENT: Eyes: No visual loss, blurred vision, double vision or yellow sclerae.No hearing loss, sneezing, congestion, runny nose or sore throat.  SKIN: No rash or itching.  CARDIOVASCULAR: per HPI RESPIRATORY:+ productive cough  GASTROINTESTINAL: No anorexia, nausea,  vomiting or diarrhea. No abdominal pain or blood.  GENITOURINARY: No burning on urination, no polyuria NEUROLOGICAL: No headache, dizziness, syncope, paralysis, ataxia, numbness or tingling in the extremities. No change in bowel or bladder control.  MUSCULOSKELETAL: No muscle, back pain, joint pain or stiffness.  LYMPHATICS: No enlarged nodes. No history of splenectomy.  PSYCHIATRIC: No history of depression or anxiety.  ENDOCRINOLOGIC: No reports of sweating, cold or heat intolerance. No polyuria or polydipsia.  Marland Kitchen   Physical Examination Filed Vitals:   02/25/15 1115  BP: 99/64  Pulse: 62   Filed Vitals:   02/25/15 1115  Height: 5\' 9"  (1.753 m)  Weight: 231 lb (104.781 kg)    Gen: resting comfortably, no acute distress HEENT: no scleral icterus, pupils equal round and reactive, no  palptable cervical adenopathy,  CV: RRR, no m/r/g, no jvd Resp: Clear to auscultation bilaterally GI: abdomen is soft, non-tender, non-distended, normal bowel sounds, no hepatosplenomegaly MSK: extremities are warm, no edema.  Skin: warm, no rash Neuro:  no focal deficits Psych: appropriate affect   Diagnostic Studies 04/2008 Cath Left main had a mid 20% stenosis.  LAD was a long vessel wrapping the apex, gave off 2 small diagonals.  There was a 30% lesion in the proximal LAD and the proximal to mid LAD.  Just after the takeoff of the small first diagonal, there was a high-  grade 99% calcified lesion. In the distal LAD, there was some mild  plaquing of about 30%.  Left circumflex gave off a tiny OM-1, tiny OM-2, large OM-3, and 2  posterolaterals. There was a 40% lesion in the mid AV groove circumflex  with diffuse minor plaquing throughout the rest of the circumflex  system. There was a 30% lesion in the proximal portion of the OM-3.  Right coronary artery was a very large dominant system, gave off a PL  and 2 posterolaterals. There was diffuse 30% stenosis throughout the  proximal midsection and a 30% lesion more distally by the  posterolaterals.  Left ventriculogram done in the RAO position showed an EF of 60%. No  regional wall motion abnormalities.  Abdominal aortogram showed patent renal arteries bilaterally. The  abdominal aorta was widely patent with no aneurysmal dilatation.  ASSESSMENT:  1. Coronary artery disease with high-grade lesion in the left anterior  descending. Nonobstructive disease in the left circumflex and  right coronary arteries.  2. Normal left ventricular function.  3. Normal renal arteries and normal abdominal aorta.  PLAN: He will need percutaneous intervention on the LAD later today  with Dr. Burt Knack. He will also need to aggressive risk factor  modification including smoking cessation.  FINAL CONCLUSIONS: Successful  stenting of the proximal LAD using a  single Xience drug-eluting stent.  RECOMMENDATIONS: Recommend Plavix and aspirin in combination for at  least 12 months. Plavix will be given twice daily for the first week in  the setting of the patient's non-ST elevation MI.  06/2011 Echo LVEF 55-60%, mild LVH, no WMAs, grade I diastolic dysfunction     Assessment and Plan  1. CAD - no current symptoms - continue current meds however I have recommened he decrease his ASA to 81mg  daily.   2. HTN - at goal, continue current meds  3. HL - in setting of known CAD change to atorvastatin 80mg  daily as recommended by most recent lipid guidelines  4. Bronchitis - will give Rx for 5 day course of aztithromycin  6. AAA - mildly enlarged by [place] in  2010, we will repeat the study   F/u 1 year. Request pcp labs.       Arnoldo Lenis, M.D.

## 2015-02-26 ENCOUNTER — Other Ambulatory Visit: Payer: Self-pay | Admitting: Cardiology

## 2015-02-26 DIAGNOSIS — I714 Abdominal aortic aneurysm, without rupture, unspecified: Secondary | ICD-10-CM

## 2015-03-06 ENCOUNTER — Ambulatory Visit: Payer: Medicare Other

## 2015-03-06 DIAGNOSIS — I714 Abdominal aortic aneurysm, without rupture, unspecified: Secondary | ICD-10-CM

## 2015-03-25 ENCOUNTER — Telehealth: Payer: Self-pay | Admitting: *Deleted

## 2015-03-25 NOTE — Telephone Encounter (Signed)
-----   Message from Arnoldo Lenis, MD sent at 03/24/2015 10:25 AM EST ----- AAA [place] looks good, no significant aneurysm   Zandra Abts MD

## 2015-03-25 NOTE — Telephone Encounter (Signed)
Pt aware, routed to pcp 

## 2015-12-17 ENCOUNTER — Encounter: Payer: Self-pay | Admitting: Cardiology

## 2016-02-18 ENCOUNTER — Other Ambulatory Visit: Payer: Self-pay | Admitting: Cardiology

## 2016-02-26 ENCOUNTER — Encounter: Payer: Self-pay | Admitting: *Deleted

## 2016-02-26 ENCOUNTER — Encounter: Payer: Self-pay | Admitting: Cardiology

## 2016-02-26 ENCOUNTER — Ambulatory Visit (INDEPENDENT_AMBULATORY_CARE_PROVIDER_SITE_OTHER): Payer: Medicare Other | Admitting: Cardiology

## 2016-02-26 VITALS — BP 111/72 | HR 67 | Ht 69.0 in | Wt 233.0 lb

## 2016-02-26 DIAGNOSIS — E782 Mixed hyperlipidemia: Secondary | ICD-10-CM | POA: Diagnosis not present

## 2016-02-26 DIAGNOSIS — I2510 Atherosclerotic heart disease of native coronary artery without angina pectoris: Secondary | ICD-10-CM | POA: Diagnosis not present

## 2016-02-26 DIAGNOSIS — I10 Essential (primary) hypertension: Secondary | ICD-10-CM | POA: Diagnosis not present

## 2016-02-26 NOTE — Progress Notes (Signed)
Clinical Summary Jason [name] is a 70 y.o.male seen today for follow up of the following medical problems.   1. CAD - prior DES to LAD 04/2008 in the setting of NSTEMI - 06/2011 echo LVEF 55-60%, no wall motion abnormalities.   - no recent chest pain. No SOB or DOE - compliant with meds   2. HTN - checks at home occasionally, 110s/80s  3. HL - compliant with statin   Past Medical History:  Diagnosis Date  . Arthritis   . CAD (coronary artery disease)    native vessel, status post drug-eluting stent to the LAD.  Marland Kitchen Chronic back pain   . Diabetes mellitus    non insulin dependant  . GERD (gastroesophageal reflux disease)   . Hypertension   . Microalbuminuria    in the setting of diabetes mellitus  . Myocardial infarction 2010  . Nephrolithiasis      No Known Allergies   Current Outpatient Prescriptions  Medication Sig Dispense Refill  . aspirin 81 MG tablet Take 81 mg by mouth daily.    Marland Kitchen atorvastatin (LIPITOR) 80 MG tablet TAKE 1 TABLET BY MOUTH DAILY. (STOP ZOCOR) 90 tablet 3  . azithromycin (ZITHROMAX) 250 MG tablet TAKE 2 TABS TODAY AND 1 TAB DAILY FOR 4 DAYS. 6 each 0  . insulin NPH-insulin regular (NOVOLIN 70/30) (70-30) 100 UNIT/ML injection Inject 68 Units into the skin 2 (two) times daily with a meal.     . lisinopril (PRINIVIL,ZESTRIL) 10 MG tablet Take 1 tablet by mouth daily.    . metFORMIN (GLUCOPHAGE) 500 MG tablet Take 1,000 mg by mouth 2 (two) times daily with a meal.     . metoprolol tartrate (LOPRESSOR) 25 MG tablet TAKE 1/2 TABLET BY MOUTH TWICE DAILY (DOSE DECREASE) 90 tablet 3  . nitroGLYCERIN (NITROSTAT) 0.4 MG SL tablet Place 0.4 mg under the tongue every 5 (five) minutes as needed. Chest pain.    . ranitidine (ZANTAC) 150 MG capsule Take 150 mg by mouth 2 (two) times daily.       No current facility-administered medications for this visit.      Past Surgical History:  Procedure Laterality Date  . CARDIAC CATHETERIZATION     with  stent  . CATARACT EXTRACTION W/PHACO  01/24/2012   Procedure: CATARACT EXTRACTION PHACO AND INTRAOCULAR LENS PLACEMENT (IOC);  Surgeon: Williams Che, MD;  Location: [facility];  Service: Ophthalmology;  Laterality: Left;  CDE:  26.77  . HERNIA REPAIR    . ROTATOR CUFF REPAIR    . TONSILLECTOMY       No Known Allergies    Family History  Problem Relation Age of Onset  . Cancer       Social History Jason [name] reports that he quit smoking about 7 years ago. His smoking use included Cigarettes. He started smoking about 47 years ago. He has a 80.00 pack-year smoking history. He has never used smokeless tobacco. Jason [name] reports that he does not drink alcohol.   Review of Systems CONSTITUTIONAL: No weight loss, fever, chills, weakness or fatigue.  HEENT: Eyes: No visual loss, blurred vision, double vision or yellow sclerae.No hearing loss, sneezing, congestion, runny nose or sore throat.  SKIN: No rash or itching.  CARDIOVASCULAR: per hpi RESPIRATORY: No shortness of breath, cough or sputum.  GASTROINTESTINAL: No anorexia, nausea, vomiting or diarrhea. No abdominal pain or blood.  GENITOURINARY: No burning on urination, no polyuria NEUROLOGICAL: No headache, dizziness, syncope, paralysis, ataxia, numbness or tingling  in the extremities. No change in bowel or bladder control.  MUSCULOSKELETAL: No muscle, back pain, joint pain or stiffness.  LYMPHATICS: No enlarged nodes. No history of splenectomy.  PSYCHIATRIC: No history of depression or anxiety.  ENDOCRINOLOGIC: No reports of sweating, cold or heat intolerance. No polyuria or polydipsia.  Marland Kitchen   Physical Examination Vitals:   02/26/16 1112  BP: 111/72  Pulse: 67   Vitals:   02/26/16 1112  Weight: 233 lb (105.7 kg)  Height: 5\' 9"  (1.753 m)    Gen: resting comfortably, no acute distress HEENT: no scleral icterus, pupils equal round and reactive, no palptable cervical adenopathy,  CV: RRR, no m/r/g, no jvd Resp: Clear  to auscultation bilaterally GI: abdomen is soft, non-tender, non-distended, normal bowel sounds, no hepatosplenomegaly MSK: extremities are warm, no edema.  Skin: warm, no rash Neuro:  no focal deficits Psych: appropriate affect   Diagnostic Studies 04/2008 Cath Left main had a mid 20% stenosis.  LAD was a long vessel wrapping the apex, gave off 2 small diagonals.  There was a 30% lesion in the proximal LAD and the proximal to mid LAD.  Just after the takeoff of the small first diagonal, there was a high-  grade 99% calcified lesion. In the distal LAD, there was some mild  plaquing of about 30%.  Left circumflex gave off a tiny OM-1, tiny OM-2, large OM-3, and 2  posterolaterals. There was a 40% lesion in the mid AV groove circumflex  with diffuse minor plaquing throughout the rest of the circumflex  system. There was a 30% lesion in the proximal portion of the OM-3.  Right coronary artery was a very large dominant system, gave off a PL  and 2 posterolaterals. There was diffuse 30% stenosis throughout the  proximal midsection and a 30% lesion more distally by the  posterolaterals.  Left ventriculogram done in the RAO position showed an EF of 60%. No  regional wall motion abnormalities.  Abdominal aortogram showed patent renal arteries bilaterally. The  abdominal aorta was widely patent with no aneurysmal dilatation.  ASSESSMENT:  1. Coronary artery disease with high-grade lesion in the left anterior  descending. Nonobstructive disease in the left circumflex and  right coronary arteries.  2. Normal left ventricular function.  3. Normal renal arteries and normal abdominal aorta.  PLAN: He will need percutaneous intervention on the LAD later today  with Dr. Burt Knack. He will also need to aggressive risk factor  modification including smoking cessation.  FINAL CONCLUSIONS: Successful stenting of the proximal LAD using a  single Xience drug-eluting stent.   RECOMMENDATIONS: Recommend Plavix and aspirin in combination for at  least 12 months. Plavix will be given twice daily for the first week in  the setting of the patient's non-ST elevation MI.  06/2011 Echo LVEF 55-60%, mild LVH, no WMAs, grade I diastolic dysfunction  [id number] AAA [place] No aneurysm  Assessment and Plan  1. CAD - no current symptoms -we wil  continue current meds   2. HTN - he is at goal, continue current meds  3. HL - continue high dose statin in setting of CAD - request labs from pcp         Arnoldo Lenis, M.D.

## 2016-02-26 NOTE — Patient Instructions (Signed)

## 2017-02-02 ENCOUNTER — Other Ambulatory Visit: Payer: Self-pay | Admitting: Cardiology

## 2017-03-24 ENCOUNTER — Ambulatory Visit: Payer: Medicare Other | Admitting: Cardiology

## 2017-04-07 ENCOUNTER — Other Ambulatory Visit: Payer: Self-pay | Admitting: Cardiology

## 2017-04-29 ENCOUNTER — Encounter: Payer: Self-pay | Admitting: *Deleted

## 2017-05-02 ENCOUNTER — Ambulatory Visit: Payer: Medicare Other | Admitting: Cardiology

## 2017-05-02 ENCOUNTER — Encounter: Payer: Self-pay | Admitting: *Deleted

## 2017-05-02 ENCOUNTER — Encounter: Payer: Self-pay | Admitting: Cardiology

## 2017-05-02 VITALS — BP 102/84 | HR 68 | Ht 69.5 in | Wt 236.0 lb

## 2017-05-02 DIAGNOSIS — I2510 Atherosclerotic heart disease of native coronary artery without angina pectoris: Secondary | ICD-10-CM | POA: Diagnosis not present

## 2017-05-02 DIAGNOSIS — E782 Mixed hyperlipidemia: Secondary | ICD-10-CM

## 2017-05-02 DIAGNOSIS — I10 Essential (primary) hypertension: Secondary | ICD-10-CM | POA: Diagnosis not present

## 2017-05-02 NOTE — Patient Instructions (Signed)

## 2017-05-02 NOTE — Progress Notes (Signed)
Clinical Summary Jason [name] is a 72 y.o.male seen today for follow up of the following medical problems.   1. CAD - prior DES to LAD 04/2008 in the setting of NSTEMI - 06/2011 echo LVEF 55-60%, no wall motion abnormalities.     - no recent chest pain, no SOB or DOE - compliant with meds.    2. HTN - checks at home, 110s/70s  3. HL  - recent labs with pcp.  - compliant with statin  Past Medical History:  Diagnosis Date  . Arthritis   . CAD (coronary artery disease)    native vessel, status post drug-eluting stent to the LAD.  Marland Kitchen Chronic back pain   . Diabetes mellitus    non insulin dependant  . GERD (gastroesophageal reflux disease)   . Hypertension   . Microalbuminuria    in the setting of diabetes mellitus  . Myocardial infarction ([place]) 2010  . Nephrolithiasis      No Known Allergies   Current Outpatient Medications  Medication Sig Dispense Refill  . aspirin 81 MG tablet Take 81 mg by mouth daily.    Marland Kitchen atorvastatin (LIPITOR) 80 MG tablet TAKE 1 TABLET BY MOUTH DAILY. (STOP ZOCOR) 90 tablet 0  . azithromycin (ZITHROMAX) 250 MG tablet TAKE 2 TABS TODAY AND 1 TAB DAILY FOR 4 DAYS. 6 each 0  . insulin NPH-insulin regular (NOVOLIN 70/30) (70-30) 100 UNIT/ML injection Inject 68 Units into the skin 2 (two) times daily with a meal.     . lisinopril (PRINIVIL,ZESTRIL) 10 MG tablet Take 1 tablet by mouth daily.    . metFORMIN (GLUCOPHAGE) 500 MG tablet Take 1,000 mg by mouth 2 (two) times daily with a meal.     . metoprolol tartrate (LOPRESSOR) 25 MG tablet TAKE 1/2 TABLET BY MOUTH TWICE DAILY (DOSE DECREASE) 90 tablet 0  . nitroGLYCERIN (NITROSTAT) 0.4 MG SL tablet Place 0.4 mg under the tongue every 5 (five) minutes as needed. Chest pain.    . ranitidine (ZANTAC) 150 MG capsule Take 150 mg by mouth 2 (two) times daily.       No current facility-administered medications for this visit.      Past Surgical History:  Procedure Laterality Date  . CARDIAC  CATHETERIZATION     with stent  . CATARACT EXTRACTION W/PHACO  01/24/2012   Procedure: CATARACT EXTRACTION PHACO AND INTRAOCULAR LENS PLACEMENT (IOC);  Surgeon: Williams Che, MD;  Location: [facility];  Service: Ophthalmology;  Laterality: Left;  CDE:  26.77  . HERNIA REPAIR    . ROTATOR CUFF REPAIR    . TONSILLECTOMY       No Known Allergies    Family History  Problem Relation Age of Onset  . Cancer Unknown      Social History Jason [name] reports that he quit smoking about 9 years ago. His smoking use included cigarettes. He started smoking about 48 years ago. He has a 80.00 pack-year smoking history. he has never used smokeless tobacco. Jason [name] reports that he does not drink alcohol.   Review of Systems CONSTITUTIONAL: No weight loss, fever, chills, weakness or fatigue.  HEENT: Eyes: No visual loss, blurred vision, double vision or yellow sclerae.No hearing loss, sneezing, congestion, runny nose or sore throat.  SKIN: No rash or itching.  CARDIOVASCULAR: per hpi RESPIRATORY: No shortness of breath, cough or sputum.  GASTROINTESTINAL: No anorexia, nausea, vomiting or diarrhea. No abdominal pain or blood.  GENITOURINARY: No burning on urination, no polyuria NEUROLOGICAL:  No headache, dizziness, syncope, paralysis, ataxia, numbness or tingling in the extremities. No change in bowel or bladder control.  MUSCULOSKELETAL: No muscle, back pain, joint pain or stiffness.  LYMPHATICS: No enlarged nodes. No history of splenectomy.  PSYCHIATRIC: No history of depression or anxiety.  ENDOCRINOLOGIC: No reports of sweating, cold or heat intolerance. No polyuria or polydipsia.  Marland Kitchen   Physical Examination Vitals:   05/02/17 1101  BP: 102/84  Pulse: 68  SpO2: 96%   Vitals:   05/02/17 1101  Weight: 236 lb (107 kg)  Height: 5' 9.5" (1.765 m)    Gen: resting comfortably, no acute distress HEENT: no scleral icterus, pupils equal round and reactive, no palptable cervical  adenopathy,  CV: RRR, no m/r/g, no jvd Resp: Clear to auscultation bilaterally GI: abdomen is soft, non-tender, non-distended, normal bowel sounds, no hepatosplenomegaly MSK: extremities are warm, no edema.  Skin: warm, no rash Neuro:  no focal deficits Psych: appropriate affect   Diagnostic Studies 04/2008 Cath Left main had a mid 20% stenosis.  LAD was a long vessel wrapping the apex, gave off 2 small diagonals.  There was a 30% lesion in the proximal LAD and the proximal to mid LAD.  Just after the takeoff of the small first diagonal, there was a high-  grade 99% calcified lesion. In the distal LAD, there was some mild  plaquing of about 30%.  Left circumflex gave off a tiny OM-1, tiny OM-2, large OM-3, and 2  posterolaterals. There was a 40% lesion in the mid AV groove circumflex  with diffuse minor plaquing throughout the rest of the circumflex  system. There was a 30% lesion in the proximal portion of the OM-3.  Right coronary artery was a very large dominant system, gave off a PL  and 2 posterolaterals. There was diffuse 30% stenosis throughout the  proximal midsection and a 30% lesion more distally by the  posterolaterals.  Left ventriculogram done in the RAO position showed an EF of 60%. No  regional wall motion abnormalities.  Abdominal aortogram showed patent renal arteries bilaterally. The  abdominal aorta was widely patent with no aneurysmal dilatation.  ASSESSMENT:  1. Coronary artery disease with high-grade lesion in the left anterior  descending. Nonobstructive disease in the left circumflex and  right coronary arteries.  2. Normal left ventricular function.  3. Normal renal arteries and normal abdominal aorta.  PLAN: He will need percutaneous intervention on the LAD later today  with Dr. Burt Knack. He will also need to aggressive risk factor  modification including smoking cessation.  FINAL CONCLUSIONS: Successful stenting of the  proximal LAD using a  single Xience drug-eluting stent.  RECOMMENDATIONS: Recommend Plavix and aspirin in combination for at  least 12 months. Plavix will be given twice daily for the first week in  the setting of the patient's non-ST elevation MI.  06/2011 Echo LVEF 55-60%, mild LVH, no WMAs, grade I diastolic dysfunction  [id number] AAA [place] No aneurysm    Assessment and Plan  1. CAD - no symptoms, continue current meds  2. HTN - bp at goal, continue current meds  3. HL -continue statin, request labs from pcp - ekg in clinic today shows SR, no ischemic changes      Arnoldo Lenis, M.D., F.A.C.C.

## 2017-05-06 ENCOUNTER — Encounter: Payer: Self-pay | Admitting: Cardiology

## 2017-06-27 ENCOUNTER — Other Ambulatory Visit: Payer: Self-pay | Admitting: Cardiology

## 2017-12-06 ENCOUNTER — Other Ambulatory Visit: Payer: Self-pay | Admitting: Cardiology

## 2018-05-04 ENCOUNTER — Encounter: Payer: Self-pay | Admitting: *Deleted

## 2018-05-05 ENCOUNTER — Ambulatory Visit: Payer: Medicare Other | Admitting: Cardiology

## 2018-05-05 ENCOUNTER — Encounter: Payer: Self-pay | Admitting: Cardiology

## 2018-05-05 ENCOUNTER — Encounter: Payer: Self-pay | Admitting: *Deleted

## 2018-05-05 VITALS — BP 120/70 | HR 71 | Ht 69.0 in | Wt 233.0 lb

## 2018-05-05 DIAGNOSIS — I2510 Atherosclerotic heart disease of native coronary artery without angina pectoris: Secondary | ICD-10-CM

## 2018-05-05 DIAGNOSIS — E782 Mixed hyperlipidemia: Secondary | ICD-10-CM

## 2018-05-05 DIAGNOSIS — I10 Essential (primary) hypertension: Secondary | ICD-10-CM | POA: Diagnosis not present

## 2018-05-05 NOTE — Patient Instructions (Signed)
Medication Instructions:  Continue all current medications.  Follow-Up: Your physician wants you to follow up in:  1 year.  You will receive a reminder letter in the mail one-two months in advance.  If you don't receive a letter, please call our office to schedule the follow up appointment   Any Other Special Instructions Will Be Listed Below (If Applicable).  If you need a refill on your cardiac medications before your next appointment, please call your pharmacy.

## 2018-05-05 NOTE — Progress Notes (Signed)
Clinical Summary Jason [name] is a 73 y.o.male seen today for follow up of the following medical problems.   1. CAD - prior DES to LAD 04/2008 in the setting of NSTEMI - 06/2011 echo LVEF 55-60%, no wall motion abnormalities.    - denies any chest pain, no SOB/DOE - compliant with meds  2. HTN - compliant with meds - SBP 120s/80s at home.   3. HL - labs followed by pcp - compliant with statin   Past Medical History:  Diagnosis Date  . Arthritis   . CAD (coronary artery disease)    native vessel, status post drug-eluting stent to the LAD.  Marland Kitchen Chronic back pain   . Diabetes mellitus    non insulin dependant  . GERD (gastroesophageal reflux disease)   . Hypertension   . Microalbuminuria    in the setting of diabetes mellitus  . Myocardial infarction ([place]) 2010  . Nephrolithiasis      No Known Allergies   Current Outpatient Medications  Medication Sig Dispense Refill  . aspirin 81 MG tablet Take 81 mg by mouth daily.    Marland Kitchen atorvastatin (LIPITOR) 80 MG tablet TAKE 1 TABLET BY MOUTH DAILY. (STOP ZOCOR) 90 tablet 1  . insulin NPH-insulin regular (NOVOLIN 70/30) (70-30) 100 UNIT/ML injection Inject 68 Units into the skin 2 (two) times daily with a meal.     . lisinopril (PRINIVIL,ZESTRIL) 10 MG tablet Take 1 tablet by mouth daily.    . metFORMIN (GLUCOPHAGE) 500 MG tablet Take 1,000 mg by mouth 2 (two) times daily with a meal.     . metoprolol tartrate (LOPRESSOR) 25 MG tablet TAKE 1/2 TABLET BY MOUTH TWICE DAILY (DOSE DECREASE) 90 tablet 0  . nitroGLYCERIN (NITROSTAT) 0.4 MG SL tablet Place 0.4 mg under the tongue every 5 (five) minutes as needed. Chest pain.    . ranitidine (ZANTAC) 150 MG capsule Take 150 mg by mouth 2 (two) times daily.       No current facility-administered medications for this visit.      Past Surgical History:  Procedure Laterality Date  . CARDIAC CATHETERIZATION     with stent  . CATARACT EXTRACTION W/PHACO  01/24/2012   Procedure:  CATARACT EXTRACTION PHACO AND INTRAOCULAR LENS PLACEMENT (IOC);  Surgeon: Williams Che, MD;  Location: [facility];  Service: Ophthalmology;  Laterality: Left;  CDE:  26.77  . HERNIA REPAIR    . ROTATOR CUFF REPAIR    . TONSILLECTOMY       No Known Allergies    Family History  Problem Relation Age of Onset  . Cancer Other      Social History Jason [name] reports that he quit smoking about 10 years ago. His smoking use included cigarettes. He started smoking about 49 years ago. He has a 80.00 pack-year smoking history. He has never used smokeless tobacco. Jason [name] reports no history of alcohol use.   Review of Systems CONSTITUTIONAL: No weight loss, fever, chills, weakness or fatigue.  HEENT: Eyes: No visual loss, blurred vision, double vision or yellow sclerae.No hearing loss, sneezing, congestion, runny nose or sore throat.  SKIN: No rash or itching.  CARDIOVASCULAR: per hpi RESPIRATORY: No shortness of breath, cough or sputum.  GASTROINTESTINAL: No anorexia, nausea, vomiting or diarrhea. No abdominal pain or blood.  GENITOURINARY: No burning on urination, no polyuria NEUROLOGICAL: No headache, dizziness, syncope, paralysis, ataxia, numbness or tingling in the extremities. No change in bowel or bladder control.  MUSCULOSKELETAL: No muscle,  back pain, joint pain or stiffness.  LYMPHATICS: No enlarged nodes. No history of splenectomy.  PSYCHIATRIC: No history of depression or anxiety.  ENDOCRINOLOGIC: No reports of sweating, cold or heat intolerance. No polyuria or polydipsia.  Marland Kitchen   Physical Examination Vitals:   05/05/18 0906  BP: 120/70  Pulse: 71  SpO2: 94%   Vitals:   05/05/18 0906  Weight: 233 lb (105.7 kg)  Height: 5\' 9"  (1.753 m)    Gen: resting comfortably, no acute distress HEENT: no scleral icterus, pupils equal round and reactive, no palptable cervical adenopathy,  CV: RRR, no m/r/g, no jvd Resp: Clear to auscultation bilaterally GI: abdomen is soft,  non-tender, non-distended, normal bowel sounds, no hepatosplenomegaly MSK: extremities are warm, no edema.  Skin: warm, no rash Neuro:  no focal deficits Psych: appropriate affect   Diagnostic Studies 04/2008 Cath Left main had a mid 20% stenosis.  LAD was a long vessel wrapping the apex, gave off 2 small diagonals.  There was a 30% lesion in the proximal LAD and the proximal to mid LAD.  Just after the takeoff of the small first diagonal, there was a high-  grade 99% calcified lesion. In the distal LAD, there was some mild  plaquing of about 30%.  Left circumflex gave off a tiny OM-1, tiny OM-2, large OM-3, and 2  posterolaterals. There was a 40% lesion in the mid AV groove circumflex  with diffuse minor plaquing throughout the rest of the circumflex  system. There was a 30% lesion in the proximal portion of the OM-3.  Right coronary artery was a very large dominant system, gave off a PL  and 2 posterolaterals. There was diffuse 30% stenosis throughout the  proximal midsection and a 30% lesion more distally by the  posterolaterals.  Left ventriculogram done in the RAO position showed an EF of 60%. No  regional wall motion abnormalities.  Abdominal aortogram showed patent renal arteries bilaterally. The  abdominal aorta was widely patent with no aneurysmal dilatation.  ASSESSMENT:  1. Coronary artery disease with high-grade lesion in the left anterior  descending. Nonobstructive disease in the left circumflex and  right coronary arteries.  2. Normal left ventricular function.  3. Normal renal arteries and normal abdominal aorta.  PLAN: He will need percutaneous intervention on the LAD later today  with Dr. Burt Knack. He will also need to aggressive risk factor  modification including smoking cessation.  FINAL CONCLUSIONS: Successful stenting of the proximal LAD using a  single Xience drug-eluting stent.  RECOMMENDATIONS: Recommend Plavix and aspirin in  combination for at  least 12 months. Plavix will be given twice daily for the first week in  the setting of the patient's non-ST elevation MI.  06/2011 Echo LVEF 55-60%, mild LVH, no WMAs, grade I diastolic dysfunction  67/1245 AAA [place] No aneurysm    Assessment and Plan  1. CAD -doing well without symptoms, continue current meds - EKG today shows SR, no acute ischemic changes.   2. HTN -he is at goal, continue current meds  3. HL -continue statin, request labs from pcp  F/u 1 year      Arnoldo Lenis, M.D.

## 2018-05-31 ENCOUNTER — Other Ambulatory Visit: Payer: Self-pay | Admitting: Cardiology

## 2018-11-25 ENCOUNTER — Other Ambulatory Visit: Payer: Self-pay | Admitting: Cardiology

## 2019-05-24 ENCOUNTER — Other Ambulatory Visit: Payer: Self-pay | Admitting: Cardiology

## 2019-06-07 ENCOUNTER — Other Ambulatory Visit: Payer: Self-pay

## 2019-06-07 ENCOUNTER — Encounter: Payer: Self-pay | Admitting: Family Medicine

## 2019-06-07 ENCOUNTER — Encounter: Payer: Self-pay | Admitting: *Deleted

## 2019-06-07 ENCOUNTER — Ambulatory Visit (INDEPENDENT_AMBULATORY_CARE_PROVIDER_SITE_OTHER): Payer: Medicare Other | Admitting: Family Medicine

## 2019-06-07 VITALS — BP 106/60 | HR 68 | Ht 69.0 in | Wt 229.4 lb

## 2019-06-07 DIAGNOSIS — I10 Essential (primary) hypertension: Secondary | ICD-10-CM

## 2019-06-07 DIAGNOSIS — I2510 Atherosclerotic heart disease of native coronary artery without angina pectoris: Secondary | ICD-10-CM | POA: Diagnosis not present

## 2019-06-07 DIAGNOSIS — E782 Mixed hyperlipidemia: Secondary | ICD-10-CM

## 2019-06-07 NOTE — Progress Notes (Signed)
Cardiology Office Note  Date: 06/07/2019   ID: ALMOND FRIES, DOB 07-Jun-1945, MRN [id number]  PCP:  Glenda Chroman, MD  Cardiologist:  Carlyle Dolly, MD Electrophysiologist:  None   Chief Complaint: 1 Year F/U CAD, HTN, HLD  History of Present Illness: Jason [name] is a 74 y.o. male with a history of CAD, HTN, HLD.  Prior DES to LAD 04/2008 in setting of NSTEMI.  Last saw Dr. Harl Bowie on 05/05/2018.  During that visit patient had no complaints of chest pain, shortness of breath, or DOE.  He was compliant with his medications at home for hypertension and hyperlipidemia.  Patient states he has been doing well from a cardiac standpoint.  He denies any recent acute illnesses, hospitalizations, travels.  States he has received both doses of the Covid vaccine.  Denies any progressive anginal or exertional symptoms, palpitations or arrhythmias, orthostatic symptoms, stroke or TIA-like symptoms, blood in urine or stool.  Denies any claudication-like symptoms, DVT or PE-like symptoms, or lower extremity edema.  Blood pressure has been well controlled on current medications.  States he has had some recent lab work from his primary care office.  Past Medical History:  Diagnosis Date  . Arthritis   . CAD (coronary artery disease)    native vessel, status post drug-eluting stent to the LAD.  Marland Kitchen Chronic back pain   . Diabetes mellitus    non insulin dependant  . GERD (gastroesophageal reflux disease)   . Hypertension   . Microalbuminuria    in the setting of diabetes mellitus  . Myocardial infarction ([place]) 2010  . Nephrolithiasis     Past Surgical History:  Procedure Laterality Date  . CARDIAC CATHETERIZATION     with stent  . CATARACT EXTRACTION W/PHACO  01/24/2012   Procedure: CATARACT EXTRACTION PHACO AND INTRAOCULAR LENS PLACEMENT (IOC);  Surgeon: Williams Che, MD;  Location: [facility];  Service: Ophthalmology;  Laterality: Left;  CDE:  26.77  . HERNIA REPAIR    . ROTATOR CUFF  REPAIR    . TONSILLECTOMY      Current Outpatient Medications  Medication Sig Dispense Refill  . aspirin 81 MG tablet Take 81 mg by mouth daily.    Marland Kitchen atorvastatin (LIPITOR) 80 MG tablet TAKE 1 TABLET BY MOUTH EVERY DAY 30 tablet 0  . famotidine (PEPCID) 40 MG tablet Take by mouth daily.    . insulin NPH-insulin regular (NOVOLIN 70/30) (70-30) 100 UNIT/ML injection Inject 68 Units into the skin 2 (two) times daily with a meal.     . lisinopril (PRINIVIL,ZESTRIL) 10 MG tablet Take 1 tablet by mouth daily.    . metFORMIN (GLUCOPHAGE) 500 MG tablet Take 1,000 mg by mouth 2 (two) times daily with a meal.     . metoprolol tartrate (LOPRESSOR) 25 MG tablet TAKE 1/2 TABLET BY MOUTH TWICE DAILY (DOSE DECREASE) 90 tablet 0  . nitroGLYCERIN (NITROSTAT) 0.4 MG SL tablet Place 0.4 mg under the tongue every 5 (five) minutes as needed. Chest pain.     No current facility-administered medications for this visit.   Allergies:  Patient has no known allergies.   Social History: The patient  reports that he quit smoking about 11 years ago. His smoking use included cigarettes. He started smoking about 50 years ago. He has a 80.00 pack-year smoking history. He has never used smokeless tobacco. He reports that he does not drink alcohol or use drugs.   Family History: The patient's family history includes Cancer in  an other family member.   ROS:  Please see the history of present illness. Otherwise, complete review of systems is positive for none.  All other systems are reviewed and negative.   Physical Exam: VS:  There were no vitals taken for this visit., BMI There is no height or weight on file to calculate BMI.  Wt Readings from Last 3 Encounters:  05/05/18 233 lb (105.7 kg)  05/02/17 236 lb (107 kg)  02/26/16 233 lb (105.7 kg)    General: Patient appears comfortable at rest. HEENT: Conjunctiva and lids normal, oropharynx clear with moist mucosa. Neck: Supple, no elevated JVP or carotid bruits, no  thyromegaly. Lungs: Clear to auscultation, nonlabored breathing at rest. Cardiac: Regular rate and rhythm, no S3 or significant systolic murmur, no pericardial rub. Abdomen: Soft, nontender, no hepatomegaly, bowel sounds present, no guarding or rebound. Extremities: No pitting edema, distal pulses 2+. Skin: Warm and dry. Musculoskeletal: No kyphosis. Neuropsychiatric: Alert and oriented x3, affect grossly appropriate.  ECG: 06/07/2019.  EKG today shows normal sinus rhythm with rate of 70.  No acute ST or T wave changes normal axis  Recent Labwork: No results found for requested labs within last 8760 hours.  No results found for: CHOL, TRIG, HDL, CHOLHDL, VLDL, LDLCALC, LDLDIRECT  Other Studies Reviewed Today:  04/2008 Cath Left main had a mid 20% stenosis.  LAD was a long vessel wrapping the apex, gave off 2 small diagonals.  There was a 30% lesion in the proximal LAD and the proximal to mid LAD.  Just after the takeoff of the small first diagonal, there was a high-  grade 99% calcified lesion. In the distal LAD, there was some mild  plaquing of about 30%.  Left circumflex gave off a tiny OM-1, tiny OM-2, large OM-3, and 2  posterolaterals. There was a 40% lesion in the mid AV groove circumflex  with diffuse minor plaquing throughout the rest of the circumflex  system. There was a 30% lesion in the proximal portion of the OM-3.  Right coronary artery was a very large dominant system, gave off a PL  and 2 posterolaterals. There was diffuse 30% stenosis throughout the  proximal midsection and a 30% lesion more distally by the  posterolaterals.  Left ventriculogram done in the RAO position showed an EF of 60%. No  regional wall motion abnormalities.  Abdominal aortogram showed patent renal arteries bilaterally. The  abdominal aorta was widely patent with no aneurysmal dilatation.  ASSESSMENT:  1. Coronary artery disease with high-grade lesion in the left anterior   descending. Nonobstructive disease in the left circumflex and  right coronary arteries.  2. Normal left ventricular function.  3. Normal renal arteries and normal abdominal aorta.  PLAN: He will need percutaneous intervention on the LAD later today  with Dr. Burt Knack. He will also need to aggressive risk factor  modification including smoking cessation.  FINAL CONCLUSIONS: Successful stenting of the proximal LAD using a  single Xience drug-eluting stent.  RECOMMENDATIONS: Recommend Plavix and aspirin in combination for at  least 12 months. Plavix will be given twice daily for the first week in  the setting of the patient's non-ST elevation MI.  06/2011 Echo LVEF 55-60%, mild LVH, no WMAs, grade I diastolic dysfunction  [id number] AAA [place] No aneurysm  Assessment and Plan:  1. CAD in native artery   2. Essential hypertension   3. Mixed hyperlipidemia    1. CAD in native artery Denies any recent progressive anginal or exertional symptoms.  Advised to continue 81 mg aspirin daily, nitroglycerin sublingual as needed.  2. Essential hypertension Blood pressure currently well controlled.  BP today 106/60 heart rate of 68.  Continue metoprolol 25 mg 1/2 tablet twice daily.  3. Mixed hyperlipidemia Patient states he is recently had lab work at his PCP office at least twice since interim of the last visit here.  We will attempt to obtain those results.  If there are no fasting lipid profiles or liver function test and these results we will order.  Continue atorvastatin 80 mg daily.  Medication Adjustments/Labs and Tests Ordered: Current medicines are reviewed at length with the patient today.  Concerns regarding medicines are outlined above.   Disposition: Follow-up with Dr Harl Bowie or APP  Signed, Levell July, NP 06/07/2019 5:14 AM    [facility] [place], [place], [place] [address] Phone: [phone]; Fax: [phone]

## 2019-06-07 NOTE — Progress Notes (Deleted)
Cardiology Office Note  Date: 06/07/2019   ID: Jason [name], DOB November 14, 1945, MRN [id number]  PCP:  Glenda Chroman, MD  Cardiologist:  Carlyle Dolly, MD Electrophysiologist:  None   Chief Complaint: 1 Year F/U CAD, HTN, HLD  History of Present Illness: Jason [name] is a 74 y.o. male with a history of CAD, HTN, HLD.  Prior DES to LAD 04/2008 in setting of NSTEMI.  Last saw Dr. Harl Bowie on 05/05/2018.  During that visit patient had no complaints of chest pain, shortness of breath, or DOE.  He was compliant with his medications at home for hypertension and hyperlipidemia.  Past Medical History:  Diagnosis Date  . Arthritis   . CAD (coronary artery disease)    native vessel, status post drug-eluting stent to the LAD.  Marland Kitchen Chronic back pain   . Diabetes mellitus    non insulin dependant  . GERD (gastroesophageal reflux disease)   . Hypertension   . Microalbuminuria    in the setting of diabetes mellitus  . Myocardial infarction ([place]) 2010  . Nephrolithiasis     Past Surgical History:  Procedure Laterality Date  . CARDIAC CATHETERIZATION     with stent  . CATARACT EXTRACTION W/PHACO  01/24/2012   Procedure: CATARACT EXTRACTION PHACO AND INTRAOCULAR LENS PLACEMENT (IOC);  Surgeon: Williams Che, MD;  Location: [facility];  Service: Ophthalmology;  Laterality: Left;  CDE:  26.77  . HERNIA REPAIR    . ROTATOR CUFF REPAIR    . TONSILLECTOMY      Current Outpatient Medications  Medication Sig Dispense Refill  . aspirin 81 MG tablet Take 81 mg by mouth daily.    Marland Kitchen atorvastatin (LIPITOR) 80 MG tablet TAKE 1 TABLET BY MOUTH EVERY DAY 30 tablet 0  . famotidine (PEPCID) 40 MG tablet Take by mouth daily.    . insulin NPH-insulin regular (NOVOLIN 70/30) (70-30) 100 UNIT/ML injection Inject 68 Units into the skin 2 (two) times daily with a meal.     . lisinopril (PRINIVIL,ZESTRIL) 10 MG tablet Take 1 tablet by mouth daily.    . metFORMIN (GLUCOPHAGE) 500 MG tablet Take 1,000 mg by  mouth 2 (two) times daily with a meal.     . metoprolol tartrate (LOPRESSOR) 25 MG tablet TAKE 1/2 TABLET BY MOUTH TWICE DAILY (DOSE DECREASE) 90 tablet 0  . nitroGLYCERIN (NITROSTAT) 0.4 MG SL tablet Place 0.4 mg under the tongue every 5 (five) minutes as needed. Chest pain.     No current facility-administered medications for this visit.   Allergies:  Patient has no known allergies.   Social History: The patient  reports that he quit smoking about 11 years ago. His smoking use included cigarettes. He started smoking about 50 years ago. He has a 80.00 pack-year smoking history. He has never used smokeless tobacco. He reports that he does not drink alcohol or use drugs.   Family History: The patient's family history includes Cancer in an other family member.   ROS:  Please see the history of present illness. Otherwise, complete review of systems is positive for none.  All other systems are reviewed and negative.   Physical Exam: VS:  There were no vitals taken for this visit., BMI There is no height or weight on file to calculate BMI.  Wt Readings from Last 3 Encounters:  05/05/18 233 lb (105.7 kg)  05/02/17 236 lb (107 kg)  02/26/16 233 lb (105.7 kg)    General: Patient appears comfortable at  rest. HEENT: Conjunctiva and lids normal, oropharynx clear with moist mucosa. Neck: Supple, no elevated JVP or carotid bruits, no thyromegaly. Lungs: Clear to auscultation, nonlabored breathing at rest. Cardiac: Regular rate and rhythm, no S3 or significant systolic murmur, no pericardial rub. Abdomen: Soft, nontender, no hepatomegaly, bowel sounds present, no guarding or rebound. Extremities: No pitting edema, distal pulses 2+. Skin: Warm and dry. Musculoskeletal: No kyphosis. Neuropsychiatric: Alert and oriented x3, affect grossly appropriate.  ECG:  An ECG dated *** was personally reviewed today and demonstrated:  ***  Recent Labwork: No results found for requested labs within last 8760  hours.  No results found for: CHOL, TRIG, HDL, CHOLHDL, VLDL, LDLCALC, LDLDIRECT  Other Studies Reviewed Today:  04/2008 Cath Left main had a mid 20% stenosis.  LAD was a long vessel wrapping the apex, gave off 2 small diagonals.  There was a 30% lesion in the proximal LAD and the proximal to mid LAD.  Just after the takeoff of the small first diagonal, there was a high-  grade 99% calcified lesion. In the distal LAD, there was some mild  plaquing of about 30%.  Left circumflex gave off a tiny OM-1, tiny OM-2, large OM-3, and 2  posterolaterals. There was a 40% lesion in the mid AV groove circumflex  with diffuse minor plaquing throughout the rest of the circumflex  system. There was a 30% lesion in the proximal portion of the OM-3.  Right coronary artery was a very large dominant system, gave off a PL  and 2 posterolaterals. There was diffuse 30% stenosis throughout the  proximal midsection and a 30% lesion more distally by the  posterolaterals.  Left ventriculogram done in the RAO position showed an EF of 60%. No  regional wall motion abnormalities.  Abdominal aortogram showed patent renal arteries bilaterally. The  abdominal aorta was widely patent with no aneurysmal dilatation.  ASSESSMENT:  1. Coronary artery disease with high-grade lesion in the left anterior  descending. Nonobstructive disease in the left circumflex and  right coronary arteries.  2. Normal left ventricular function.  3. Normal renal arteries and normal abdominal aorta.  PLAN: He will need percutaneous intervention on the LAD later today  with Dr. Burt Knack. He will also need to aggressive risk factor  modification including smoking cessation.  FINAL CONCLUSIONS: Successful stenting of the proximal LAD using a  single Xience drug-eluting stent.  RECOMMENDATIONS: Recommend Plavix and aspirin in combination for at  least 12 months. Plavix will be given twice daily for the first week  in  the setting of the patient's non-ST elevation MI.  06/2011 Echo LVEF 55-60%, mild LVH, no WMAs, grade I diastolic dysfunction  [id number] AAA [place] No aneurysm  Assessment and Plan:  1. CAD in native artery   2. Essential hypertension   3. Mixed hyperlipidemia    1. CAD in native artery ***  2. Essential hypertension ***  3. Mixed hyperlipidemia ***  Medication Adjustments/Labs and Tests Ordered: Current medicines are reviewed at length with the patient today.  Concerns regarding medicines are outlined above.   Disposition: Follow-up with Dr Harl Bowie or APP  Signed, Levell July, NP 06/07/2019 5:14 AM    [facility] [place], [place],  [address] Phone: [phone]; Fax: [phone]

## 2019-06-07 NOTE — Patient Instructions (Signed)

## 2019-07-06 DIAGNOSIS — E7800 Pure hypercholesterolemia, unspecified: Secondary | ICD-10-CM | POA: Diagnosis not present

## 2019-07-06 DIAGNOSIS — M542 Cervicalgia: Secondary | ICD-10-CM | POA: Diagnosis not present

## 2019-07-09 ENCOUNTER — Other Ambulatory Visit: Payer: Self-pay | Admitting: Cardiology

## 2019-07-17 DIAGNOSIS — E7800 Pure hypercholesterolemia, unspecified: Secondary | ICD-10-CM | POA: Diagnosis not present

## 2019-07-17 DIAGNOSIS — I10 Essential (primary) hypertension: Secondary | ICD-10-CM | POA: Diagnosis not present

## 2019-07-17 DIAGNOSIS — Z0000 Encounter for general adult medical examination without abnormal findings: Secondary | ICD-10-CM | POA: Diagnosis not present

## 2019-07-17 DIAGNOSIS — E1165 Type 2 diabetes mellitus with hyperglycemia: Secondary | ICD-10-CM | POA: Diagnosis not present

## 2019-07-17 DIAGNOSIS — Z7189 Other specified counseling: Secondary | ICD-10-CM | POA: Diagnosis not present

## 2019-07-17 DIAGNOSIS — Z1211 Encounter for screening for malignant neoplasm of colon: Secondary | ICD-10-CM | POA: Diagnosis not present

## 2019-07-17 DIAGNOSIS — R5383 Other fatigue: Secondary | ICD-10-CM | POA: Diagnosis not present

## 2019-07-17 DIAGNOSIS — Z299 Encounter for prophylactic measures, unspecified: Secondary | ICD-10-CM | POA: Diagnosis not present

## 2019-07-17 DIAGNOSIS — Z79899 Other long term (current) drug therapy: Secondary | ICD-10-CM | POA: Diagnosis not present

## 2019-08-06 DIAGNOSIS — I10 Essential (primary) hypertension: Secondary | ICD-10-CM | POA: Diagnosis not present

## 2019-08-06 DIAGNOSIS — E1165 Type 2 diabetes mellitus with hyperglycemia: Secondary | ICD-10-CM | POA: Diagnosis not present

## 2019-11-05 DIAGNOSIS — E119 Type 2 diabetes mellitus without complications: Secondary | ICD-10-CM | POA: Diagnosis not present

## 2019-11-05 DIAGNOSIS — E1165 Type 2 diabetes mellitus with hyperglycemia: Secondary | ICD-10-CM | POA: Diagnosis not present

## 2019-11-05 DIAGNOSIS — Z299 Encounter for prophylactic measures, unspecified: Secondary | ICD-10-CM | POA: Diagnosis not present

## 2019-11-05 DIAGNOSIS — I10 Essential (primary) hypertension: Secondary | ICD-10-CM | POA: Diagnosis not present

## 2019-11-05 DIAGNOSIS — I714 Abdominal aortic aneurysm, without rupture: Secondary | ICD-10-CM | POA: Diagnosis not present

## 2019-11-05 DIAGNOSIS — D692 Other nonthrombocytopenic purpura: Secondary | ICD-10-CM | POA: Diagnosis not present

## 2019-11-06 DIAGNOSIS — I10 Essential (primary) hypertension: Secondary | ICD-10-CM | POA: Diagnosis not present

## 2019-11-06 DIAGNOSIS — E1165 Type 2 diabetes mellitus with hyperglycemia: Secondary | ICD-10-CM | POA: Diagnosis not present

## 2019-11-06 DIAGNOSIS — E7849 Other hyperlipidemia: Secondary | ICD-10-CM | POA: Diagnosis not present

## 2019-11-19 ENCOUNTER — Other Ambulatory Visit: Payer: Self-pay | Admitting: Cardiology

## 2020-01-04 DIAGNOSIS — E1165 Type 2 diabetes mellitus with hyperglycemia: Secondary | ICD-10-CM | POA: Diagnosis not present

## 2020-01-04 DIAGNOSIS — I10 Essential (primary) hypertension: Secondary | ICD-10-CM | POA: Diagnosis not present

## 2020-01-04 DIAGNOSIS — E7849 Other hyperlipidemia: Secondary | ICD-10-CM | POA: Diagnosis not present

## 2020-02-05 DIAGNOSIS — I2510 Atherosclerotic heart disease of native coronary artery without angina pectoris: Secondary | ICD-10-CM | POA: Diagnosis not present

## 2020-02-05 DIAGNOSIS — E1165 Type 2 diabetes mellitus with hyperglycemia: Secondary | ICD-10-CM | POA: Diagnosis not present

## 2020-02-15 DIAGNOSIS — Z299 Encounter for prophylactic measures, unspecified: Secondary | ICD-10-CM | POA: Diagnosis not present

## 2020-02-15 DIAGNOSIS — E1165 Type 2 diabetes mellitus with hyperglycemia: Secondary | ICD-10-CM | POA: Diagnosis not present

## 2020-02-15 DIAGNOSIS — I10 Essential (primary) hypertension: Secondary | ICD-10-CM | POA: Diagnosis not present

## 2020-03-07 DIAGNOSIS — E1165 Type 2 diabetes mellitus with hyperglycemia: Secondary | ICD-10-CM | POA: Diagnosis not present

## 2020-03-07 DIAGNOSIS — I2510 Atherosclerotic heart disease of native coronary artery without angina pectoris: Secondary | ICD-10-CM | POA: Diagnosis not present

## 2020-04-07 DIAGNOSIS — E1165 Type 2 diabetes mellitus with hyperglycemia: Secondary | ICD-10-CM | POA: Diagnosis not present

## 2020-04-07 DIAGNOSIS — I2510 Atherosclerotic heart disease of native coronary artery without angina pectoris: Secondary | ICD-10-CM | POA: Diagnosis not present

## 2020-05-16 ENCOUNTER — Other Ambulatory Visit: Payer: Self-pay | Admitting: Cardiology

## 2020-05-28 DIAGNOSIS — I714 Abdominal aortic aneurysm, without rupture: Secondary | ICD-10-CM | POA: Diagnosis not present

## 2020-05-28 DIAGNOSIS — I10 Essential (primary) hypertension: Secondary | ICD-10-CM | POA: Diagnosis not present

## 2020-05-28 DIAGNOSIS — I2510 Atherosclerotic heart disease of native coronary artery without angina pectoris: Secondary | ICD-10-CM | POA: Diagnosis not present

## 2020-05-28 DIAGNOSIS — Z299 Encounter for prophylactic measures, unspecified: Secondary | ICD-10-CM | POA: Diagnosis not present

## 2020-05-28 DIAGNOSIS — E1165 Type 2 diabetes mellitus with hyperglycemia: Secondary | ICD-10-CM | POA: Diagnosis not present

## 2020-06-05 DIAGNOSIS — E1165 Type 2 diabetes mellitus with hyperglycemia: Secondary | ICD-10-CM | POA: Diagnosis not present

## 2020-07-04 DIAGNOSIS — E1165 Type 2 diabetes mellitus with hyperglycemia: Secondary | ICD-10-CM | POA: Diagnosis not present

## 2020-07-05 DIAGNOSIS — I2510 Atherosclerotic heart disease of native coronary artery without angina pectoris: Secondary | ICD-10-CM | POA: Diagnosis not present

## 2020-07-05 DIAGNOSIS — E1165 Type 2 diabetes mellitus with hyperglycemia: Secondary | ICD-10-CM | POA: Diagnosis not present

## 2020-08-05 DIAGNOSIS — E1165 Type 2 diabetes mellitus with hyperglycemia: Secondary | ICD-10-CM | POA: Diagnosis not present

## 2020-08-20 DIAGNOSIS — R5383 Other fatigue: Secondary | ICD-10-CM | POA: Diagnosis not present

## 2020-08-20 DIAGNOSIS — Z0000 Encounter for general adult medical examination without abnormal findings: Secondary | ICD-10-CM | POA: Diagnosis not present

## 2020-08-20 DIAGNOSIS — R413 Other amnesia: Secondary | ICD-10-CM | POA: Diagnosis not present

## 2020-08-20 DIAGNOSIS — E7800 Pure hypercholesterolemia, unspecified: Secondary | ICD-10-CM | POA: Diagnosis not present

## 2020-08-20 DIAGNOSIS — Z7189 Other specified counseling: Secondary | ICD-10-CM | POA: Diagnosis not present

## 2020-08-20 DIAGNOSIS — Z79899 Other long term (current) drug therapy: Secondary | ICD-10-CM | POA: Diagnosis not present

## 2020-08-20 DIAGNOSIS — Z299 Encounter for prophylactic measures, unspecified: Secondary | ICD-10-CM | POA: Diagnosis not present

## 2020-08-20 DIAGNOSIS — E1165 Type 2 diabetes mellitus with hyperglycemia: Secondary | ICD-10-CM | POA: Diagnosis not present

## 2020-08-20 DIAGNOSIS — I10 Essential (primary) hypertension: Secondary | ICD-10-CM | POA: Diagnosis not present

## 2020-08-27 DIAGNOSIS — E538 Deficiency of other specified B group vitamins: Secondary | ICD-10-CM | POA: Diagnosis not present

## 2020-09-04 DIAGNOSIS — E1165 Type 2 diabetes mellitus with hyperglycemia: Secondary | ICD-10-CM | POA: Diagnosis not present

## 2020-09-04 DIAGNOSIS — I2510 Atherosclerotic heart disease of native coronary artery without angina pectoris: Secondary | ICD-10-CM | POA: Diagnosis not present

## 2020-10-03 DIAGNOSIS — E1165 Type 2 diabetes mellitus with hyperglycemia: Secondary | ICD-10-CM | POA: Diagnosis not present

## 2020-10-03 DIAGNOSIS — R413 Other amnesia: Secondary | ICD-10-CM | POA: Diagnosis not present

## 2020-10-03 DIAGNOSIS — I10 Essential (primary) hypertension: Secondary | ICD-10-CM | POA: Diagnosis not present

## 2020-10-03 DIAGNOSIS — Z299 Encounter for prophylactic measures, unspecified: Secondary | ICD-10-CM | POA: Diagnosis not present

## 2020-10-03 DIAGNOSIS — I714 Abdominal aortic aneurysm, without rupture: Secondary | ICD-10-CM | POA: Diagnosis not present

## 2020-10-05 DIAGNOSIS — E1165 Type 2 diabetes mellitus with hyperglycemia: Secondary | ICD-10-CM | POA: Diagnosis not present

## 2020-11-05 DIAGNOSIS — I2510 Atherosclerotic heart disease of native coronary artery without angina pectoris: Secondary | ICD-10-CM | POA: Diagnosis not present

## 2020-11-05 DIAGNOSIS — E1165 Type 2 diabetes mellitus with hyperglycemia: Secondary | ICD-10-CM | POA: Diagnosis not present

## 2020-11-14 ENCOUNTER — Other Ambulatory Visit: Payer: Self-pay | Admitting: Cardiology

## 2020-11-28 DIAGNOSIS — I10 Essential (primary) hypertension: Secondary | ICD-10-CM | POA: Diagnosis not present

## 2020-11-28 DIAGNOSIS — Z299 Encounter for prophylactic measures, unspecified: Secondary | ICD-10-CM | POA: Diagnosis not present

## 2020-11-28 DIAGNOSIS — Z23 Encounter for immunization: Secondary | ICD-10-CM | POA: Diagnosis not present

## 2020-11-28 DIAGNOSIS — E1165 Type 2 diabetes mellitus with hyperglycemia: Secondary | ICD-10-CM | POA: Diagnosis not present

## 2020-12-05 DIAGNOSIS — E1165 Type 2 diabetes mellitus with hyperglycemia: Secondary | ICD-10-CM | POA: Diagnosis not present

## 2021-01-05 DIAGNOSIS — E1165 Type 2 diabetes mellitus with hyperglycemia: Secondary | ICD-10-CM | POA: Diagnosis not present

## 2021-01-05 DIAGNOSIS — R413 Other amnesia: Secondary | ICD-10-CM | POA: Diagnosis not present

## 2021-01-05 DIAGNOSIS — E538 Deficiency of other specified B group vitamins: Secondary | ICD-10-CM | POA: Diagnosis not present

## 2021-02-04 DIAGNOSIS — E1165 Type 2 diabetes mellitus with hyperglycemia: Secondary | ICD-10-CM | POA: Diagnosis not present

## 2021-03-06 DIAGNOSIS — E1165 Type 2 diabetes mellitus with hyperglycemia: Secondary | ICD-10-CM | POA: Diagnosis not present

## 2021-03-16 DIAGNOSIS — D229 Melanocytic nevi, unspecified: Secondary | ICD-10-CM | POA: Diagnosis not present

## 2021-03-16 DIAGNOSIS — E1165 Type 2 diabetes mellitus with hyperglycemia: Secondary | ICD-10-CM | POA: Diagnosis not present

## 2021-03-16 DIAGNOSIS — I10 Essential (primary) hypertension: Secondary | ICD-10-CM | POA: Diagnosis not present

## 2021-03-16 DIAGNOSIS — E119 Type 2 diabetes mellitus without complications: Secondary | ICD-10-CM | POA: Diagnosis not present

## 2021-03-16 DIAGNOSIS — Z299 Encounter for prophylactic measures, unspecified: Secondary | ICD-10-CM | POA: Diagnosis not present

## 2021-04-05 DIAGNOSIS — E1165 Type 2 diabetes mellitus with hyperglycemia: Secondary | ICD-10-CM | POA: Diagnosis not present

## 2021-04-07 DIAGNOSIS — I10 Essential (primary) hypertension: Secondary | ICD-10-CM | POA: Diagnosis not present

## 2021-04-07 DIAGNOSIS — E782 Mixed hyperlipidemia: Secondary | ICD-10-CM | POA: Diagnosis not present

## 2021-05-05 DIAGNOSIS — E1165 Type 2 diabetes mellitus with hyperglycemia: Secondary | ICD-10-CM | POA: Diagnosis not present

## 2021-05-14 ENCOUNTER — Other Ambulatory Visit: Payer: Self-pay | Admitting: Cardiology

## 2021-06-04 DIAGNOSIS — E1165 Type 2 diabetes mellitus with hyperglycemia: Secondary | ICD-10-CM | POA: Diagnosis not present

## 2021-06-11 ENCOUNTER — Other Ambulatory Visit: Payer: Self-pay | Admitting: Cardiology

## 2021-06-22 DIAGNOSIS — Z713 Dietary counseling and surveillance: Secondary | ICD-10-CM | POA: Diagnosis not present

## 2021-06-22 DIAGNOSIS — E1165 Type 2 diabetes mellitus with hyperglycemia: Secondary | ICD-10-CM | POA: Diagnosis not present

## 2021-06-22 DIAGNOSIS — I10 Essential (primary) hypertension: Secondary | ICD-10-CM | POA: Diagnosis not present

## 2021-06-22 DIAGNOSIS — Z299 Encounter for prophylactic measures, unspecified: Secondary | ICD-10-CM | POA: Diagnosis not present

## 2021-07-05 DIAGNOSIS — E1165 Type 2 diabetes mellitus with hyperglycemia: Secondary | ICD-10-CM | POA: Diagnosis not present

## 2021-07-12 ENCOUNTER — Other Ambulatory Visit: Payer: Self-pay | Admitting: Cardiology

## 2021-08-04 DIAGNOSIS — E1165 Type 2 diabetes mellitus with hyperglycemia: Secondary | ICD-10-CM | POA: Diagnosis not present

## 2021-09-03 DIAGNOSIS — E1165 Type 2 diabetes mellitus with hyperglycemia: Secondary | ICD-10-CM | POA: Diagnosis not present

## 2021-09-09 DIAGNOSIS — I10 Essential (primary) hypertension: Secondary | ICD-10-CM | POA: Diagnosis not present

## 2021-09-09 DIAGNOSIS — Z0000 Encounter for general adult medical examination without abnormal findings: Secondary | ICD-10-CM | POA: Diagnosis not present

## 2021-09-09 DIAGNOSIS — Z299 Encounter for prophylactic measures, unspecified: Secondary | ICD-10-CM | POA: Diagnosis not present

## 2021-09-09 DIAGNOSIS — Z7189 Other specified counseling: Secondary | ICD-10-CM | POA: Diagnosis not present

## 2021-09-09 DIAGNOSIS — Z713 Dietary counseling and surveillance: Secondary | ICD-10-CM | POA: Diagnosis not present

## 2021-09-10 DIAGNOSIS — Z79899 Other long term (current) drug therapy: Secondary | ICD-10-CM | POA: Diagnosis not present

## 2021-09-10 DIAGNOSIS — E7800 Pure hypercholesterolemia, unspecified: Secondary | ICD-10-CM | POA: Diagnosis not present

## 2021-09-10 DIAGNOSIS — R5383 Other fatigue: Secondary | ICD-10-CM | POA: Diagnosis not present

## 2021-10-05 DIAGNOSIS — I2510 Atherosclerotic heart disease of native coronary artery without angina pectoris: Secondary | ICD-10-CM | POA: Diagnosis not present

## 2021-10-05 DIAGNOSIS — E1165 Type 2 diabetes mellitus with hyperglycemia: Secondary | ICD-10-CM | POA: Diagnosis not present

## 2021-10-19 DIAGNOSIS — I10 Essential (primary) hypertension: Secondary | ICD-10-CM | POA: Diagnosis not present

## 2021-10-19 DIAGNOSIS — Z299 Encounter for prophylactic measures, unspecified: Secondary | ICD-10-CM | POA: Diagnosis not present

## 2021-10-19 DIAGNOSIS — L729 Follicular cyst of the skin and subcutaneous tissue, unspecified: Secondary | ICD-10-CM | POA: Diagnosis not present

## 2021-10-26 DIAGNOSIS — B356 Tinea cruris: Secondary | ICD-10-CM | POA: Diagnosis not present

## 2021-10-26 DIAGNOSIS — I10 Essential (primary) hypertension: Secondary | ICD-10-CM | POA: Diagnosis not present

## 2021-10-26 DIAGNOSIS — E1165 Type 2 diabetes mellitus with hyperglycemia: Secondary | ICD-10-CM | POA: Diagnosis not present

## 2021-10-26 DIAGNOSIS — Z299 Encounter for prophylactic measures, unspecified: Secondary | ICD-10-CM | POA: Diagnosis not present

## 2021-11-04 DIAGNOSIS — E1165 Type 2 diabetes mellitus with hyperglycemia: Secondary | ICD-10-CM | POA: Diagnosis not present

## 2021-12-04 DIAGNOSIS — E1165 Type 2 diabetes mellitus with hyperglycemia: Secondary | ICD-10-CM | POA: Diagnosis not present

## 2022-01-04 DIAGNOSIS — E1165 Type 2 diabetes mellitus with hyperglycemia: Secondary | ICD-10-CM | POA: Diagnosis not present

## 2022-02-03 DIAGNOSIS — E1165 Type 2 diabetes mellitus with hyperglycemia: Secondary | ICD-10-CM | POA: Diagnosis not present

## 2022-02-15 DIAGNOSIS — E1159 Type 2 diabetes mellitus with other circulatory complications: Secondary | ICD-10-CM | POA: Diagnosis not present

## 2022-02-15 DIAGNOSIS — K219 Gastro-esophageal reflux disease without esophagitis: Secondary | ICD-10-CM | POA: Diagnosis not present

## 2022-02-15 DIAGNOSIS — Z299 Encounter for prophylactic measures, unspecified: Secondary | ICD-10-CM | POA: Diagnosis not present

## 2022-02-15 DIAGNOSIS — I10 Essential (primary) hypertension: Secondary | ICD-10-CM | POA: Diagnosis not present

## 2022-02-15 DIAGNOSIS — I152 Hypertension secondary to endocrine disorders: Secondary | ICD-10-CM | POA: Diagnosis not present

## 2022-03-05 DIAGNOSIS — E1165 Type 2 diabetes mellitus with hyperglycemia: Secondary | ICD-10-CM | POA: Diagnosis not present

## 2022-04-05 DIAGNOSIS — E1165 Type 2 diabetes mellitus with hyperglycemia: Secondary | ICD-10-CM | POA: Diagnosis not present

## 2022-05-05 DIAGNOSIS — E1165 Type 2 diabetes mellitus with hyperglycemia: Secondary | ICD-10-CM | POA: Diagnosis not present

## 2022-06-03 DIAGNOSIS — E1159 Type 2 diabetes mellitus with other circulatory complications: Secondary | ICD-10-CM | POA: Diagnosis not present

## 2022-06-03 DIAGNOSIS — I152 Hypertension secondary to endocrine disorders: Secondary | ICD-10-CM | POA: Diagnosis not present

## 2022-06-03 DIAGNOSIS — I10 Essential (primary) hypertension: Secondary | ICD-10-CM | POA: Diagnosis not present

## 2022-06-03 DIAGNOSIS — D692 Other nonthrombocytopenic purpura: Secondary | ICD-10-CM | POA: Diagnosis not present

## 2022-06-03 DIAGNOSIS — Z299 Encounter for prophylactic measures, unspecified: Secondary | ICD-10-CM | POA: Diagnosis not present

## 2022-06-05 DIAGNOSIS — E1165 Type 2 diabetes mellitus with hyperglycemia: Secondary | ICD-10-CM | POA: Diagnosis not present

## 2022-07-06 DIAGNOSIS — E1165 Type 2 diabetes mellitus with hyperglycemia: Secondary | ICD-10-CM | POA: Diagnosis not present

## 2022-08-04 DIAGNOSIS — I10 Essential (primary) hypertension: Secondary | ICD-10-CM | POA: Diagnosis not present

## 2022-08-04 DIAGNOSIS — Z299 Encounter for prophylactic measures, unspecified: Secondary | ICD-10-CM | POA: Diagnosis not present

## 2022-08-04 DIAGNOSIS — L97519 Non-pressure chronic ulcer of other part of right foot with unspecified severity: Secondary | ICD-10-CM | POA: Diagnosis not present

## 2022-08-04 DIAGNOSIS — B351 Tinea unguium: Secondary | ICD-10-CM | POA: Diagnosis not present

## 2022-08-06 DIAGNOSIS — E1165 Type 2 diabetes mellitus with hyperglycemia: Secondary | ICD-10-CM | POA: Diagnosis not present

## 2022-08-16 DIAGNOSIS — I700 Atherosclerosis of aorta: Secondary | ICD-10-CM | POA: Diagnosis not present

## 2022-08-17 DIAGNOSIS — I700 Atherosclerosis of aorta: Secondary | ICD-10-CM | POA: Diagnosis not present

## 2022-08-31 DIAGNOSIS — E1140 Type 2 diabetes mellitus with diabetic neuropathy, unspecified: Secondary | ICD-10-CM | POA: Diagnosis not present

## 2022-08-31 DIAGNOSIS — E1151 Type 2 diabetes mellitus with diabetic peripheral angiopathy without gangrene: Secondary | ICD-10-CM | POA: Diagnosis not present

## 2022-08-31 DIAGNOSIS — B351 Tinea unguium: Secondary | ICD-10-CM | POA: Diagnosis not present

## 2022-08-31 DIAGNOSIS — M79671 Pain in right foot: Secondary | ICD-10-CM | POA: Diagnosis not present

## 2022-08-31 DIAGNOSIS — M79672 Pain in left foot: Secondary | ICD-10-CM | POA: Diagnosis not present

## 2022-09-05 DIAGNOSIS — E1165 Type 2 diabetes mellitus with hyperglycemia: Secondary | ICD-10-CM | POA: Diagnosis not present

## 2022-09-16 DIAGNOSIS — Z299 Encounter for prophylactic measures, unspecified: Secondary | ICD-10-CM | POA: Diagnosis not present

## 2022-09-16 DIAGNOSIS — I152 Hypertension secondary to endocrine disorders: Secondary | ICD-10-CM | POA: Diagnosis not present

## 2022-09-16 DIAGNOSIS — E1159 Type 2 diabetes mellitus with other circulatory complications: Secondary | ICD-10-CM | POA: Diagnosis not present

## 2022-09-16 DIAGNOSIS — I10 Essential (primary) hypertension: Secondary | ICD-10-CM | POA: Diagnosis not present

## 2022-09-16 DIAGNOSIS — I714 Abdominal aortic aneurysm, without rupture, unspecified: Secondary | ICD-10-CM | POA: Diagnosis not present

## 2022-09-17 DIAGNOSIS — R5383 Other fatigue: Secondary | ICD-10-CM | POA: Diagnosis not present

## 2022-09-17 DIAGNOSIS — E7800 Pure hypercholesterolemia, unspecified: Secondary | ICD-10-CM | POA: Diagnosis not present

## 2022-09-17 DIAGNOSIS — Z299 Encounter for prophylactic measures, unspecified: Secondary | ICD-10-CM | POA: Diagnosis not present

## 2022-09-17 DIAGNOSIS — I10 Essential (primary) hypertension: Secondary | ICD-10-CM | POA: Diagnosis not present

## 2022-09-17 DIAGNOSIS — Z87891 Personal history of nicotine dependence: Secondary | ICD-10-CM | POA: Diagnosis not present

## 2022-09-17 DIAGNOSIS — Z7189 Other specified counseling: Secondary | ICD-10-CM | POA: Diagnosis not present

## 2022-09-17 DIAGNOSIS — Z79899 Other long term (current) drug therapy: Secondary | ICD-10-CM | POA: Diagnosis not present

## 2022-09-17 DIAGNOSIS — I714 Abdominal aortic aneurysm, without rupture, unspecified: Secondary | ICD-10-CM | POA: Diagnosis not present

## 2022-09-17 DIAGNOSIS — Z0000 Encounter for general adult medical examination without abnormal findings: Secondary | ICD-10-CM | POA: Diagnosis not present

## 2022-10-06 DIAGNOSIS — E1165 Type 2 diabetes mellitus with hyperglycemia: Secondary | ICD-10-CM | POA: Diagnosis not present

## 2022-11-06 DIAGNOSIS — E1165 Type 2 diabetes mellitus with hyperglycemia: Secondary | ICD-10-CM | POA: Diagnosis not present

## 2022-11-18 DIAGNOSIS — E1151 Type 2 diabetes mellitus with diabetic peripheral angiopathy without gangrene: Secondary | ICD-10-CM | POA: Diagnosis not present

## 2022-11-18 DIAGNOSIS — E1140 Type 2 diabetes mellitus with diabetic neuropathy, unspecified: Secondary | ICD-10-CM | POA: Diagnosis not present

## 2022-11-18 DIAGNOSIS — M79671 Pain in right foot: Secondary | ICD-10-CM | POA: Diagnosis not present

## 2022-11-18 DIAGNOSIS — B351 Tinea unguium: Secondary | ICD-10-CM | POA: Diagnosis not present

## 2022-11-18 DIAGNOSIS — M79672 Pain in left foot: Secondary | ICD-10-CM | POA: Diagnosis not present

## 2022-12-06 DIAGNOSIS — E1165 Type 2 diabetes mellitus with hyperglycemia: Secondary | ICD-10-CM | POA: Diagnosis not present

## 2023-01-05 DIAGNOSIS — E1165 Type 2 diabetes mellitus with hyperglycemia: Secondary | ICD-10-CM | POA: Diagnosis not present

## 2023-01-27 DIAGNOSIS — E1151 Type 2 diabetes mellitus with diabetic peripheral angiopathy without gangrene: Secondary | ICD-10-CM | POA: Diagnosis not present

## 2023-01-27 DIAGNOSIS — M79671 Pain in right foot: Secondary | ICD-10-CM | POA: Diagnosis not present

## 2023-01-27 DIAGNOSIS — E1140 Type 2 diabetes mellitus with diabetic neuropathy, unspecified: Secondary | ICD-10-CM | POA: Diagnosis not present

## 2023-01-27 DIAGNOSIS — M79672 Pain in left foot: Secondary | ICD-10-CM | POA: Diagnosis not present

## 2023-01-27 DIAGNOSIS — B351 Tinea unguium: Secondary | ICD-10-CM | POA: Diagnosis not present

## 2023-02-04 DIAGNOSIS — E1165 Type 2 diabetes mellitus with hyperglycemia: Secondary | ICD-10-CM | POA: Diagnosis not present

## 2023-03-07 DIAGNOSIS — E1165 Type 2 diabetes mellitus with hyperglycemia: Secondary | ICD-10-CM | POA: Diagnosis not present

## 2023-04-05 DIAGNOSIS — E1151 Type 2 diabetes mellitus with diabetic peripheral angiopathy without gangrene: Secondary | ICD-10-CM | POA: Diagnosis not present

## 2023-04-05 DIAGNOSIS — M79671 Pain in right foot: Secondary | ICD-10-CM | POA: Diagnosis not present

## 2023-04-05 DIAGNOSIS — B351 Tinea unguium: Secondary | ICD-10-CM | POA: Diagnosis not present

## 2023-04-05 DIAGNOSIS — E1140 Type 2 diabetes mellitus with diabetic neuropathy, unspecified: Secondary | ICD-10-CM | POA: Diagnosis not present

## 2023-04-05 DIAGNOSIS — M79672 Pain in left foot: Secondary | ICD-10-CM | POA: Diagnosis not present

## 2023-04-06 DIAGNOSIS — E1165 Type 2 diabetes mellitus with hyperglycemia: Secondary | ICD-10-CM | POA: Diagnosis not present

## 2023-05-06 DIAGNOSIS — E1165 Type 2 diabetes mellitus with hyperglycemia: Secondary | ICD-10-CM | POA: Diagnosis not present

## 2023-06-05 DIAGNOSIS — E1165 Type 2 diabetes mellitus with hyperglycemia: Secondary | ICD-10-CM | POA: Diagnosis not present

## 2023-06-21 DIAGNOSIS — E1140 Type 2 diabetes mellitus with diabetic neuropathy, unspecified: Secondary | ICD-10-CM | POA: Diagnosis not present

## 2023-06-21 DIAGNOSIS — E1151 Type 2 diabetes mellitus with diabetic peripheral angiopathy without gangrene: Secondary | ICD-10-CM | POA: Diagnosis not present

## 2023-06-21 DIAGNOSIS — M79672 Pain in left foot: Secondary | ICD-10-CM | POA: Diagnosis not present

## 2023-06-21 DIAGNOSIS — B351 Tinea unguium: Secondary | ICD-10-CM | POA: Diagnosis not present

## 2023-06-21 DIAGNOSIS — M79671 Pain in right foot: Secondary | ICD-10-CM | POA: Diagnosis not present

## 2023-07-06 DIAGNOSIS — E1165 Type 2 diabetes mellitus with hyperglycemia: Secondary | ICD-10-CM | POA: Diagnosis not present

## 2023-08-03 DIAGNOSIS — Z713 Dietary counseling and surveillance: Secondary | ICD-10-CM | POA: Diagnosis not present

## 2023-08-03 DIAGNOSIS — R5383 Other fatigue: Secondary | ICD-10-CM | POA: Diagnosis not present

## 2023-08-03 DIAGNOSIS — I10 Essential (primary) hypertension: Secondary | ICD-10-CM | POA: Diagnosis not present

## 2023-08-03 DIAGNOSIS — I714 Abdominal aortic aneurysm, without rupture, unspecified: Secondary | ICD-10-CM | POA: Diagnosis not present

## 2023-08-03 DIAGNOSIS — Z0000 Encounter for general adult medical examination without abnormal findings: Secondary | ICD-10-CM | POA: Diagnosis not present

## 2023-08-03 DIAGNOSIS — Z299 Encounter for prophylactic measures, unspecified: Secondary | ICD-10-CM | POA: Diagnosis not present

## 2023-08-03 DIAGNOSIS — Z7189 Other specified counseling: Secondary | ICD-10-CM | POA: Diagnosis not present

## 2023-08-03 DIAGNOSIS — Z1389 Encounter for screening for other disorder: Secondary | ICD-10-CM | POA: Diagnosis not present

## 2023-08-06 DIAGNOSIS — E1165 Type 2 diabetes mellitus with hyperglycemia: Secondary | ICD-10-CM | POA: Diagnosis not present

## 2023-08-30 DIAGNOSIS — E1140 Type 2 diabetes mellitus with diabetic neuropathy, unspecified: Secondary | ICD-10-CM | POA: Diagnosis not present

## 2023-08-30 DIAGNOSIS — M79672 Pain in left foot: Secondary | ICD-10-CM | POA: Diagnosis not present

## 2023-08-30 DIAGNOSIS — B351 Tinea unguium: Secondary | ICD-10-CM | POA: Diagnosis not present

## 2023-08-30 DIAGNOSIS — E1151 Type 2 diabetes mellitus with diabetic peripheral angiopathy without gangrene: Secondary | ICD-10-CM | POA: Diagnosis not present

## 2023-08-30 DIAGNOSIS — M79671 Pain in right foot: Secondary | ICD-10-CM | POA: Diagnosis not present

## 2023-09-05 DIAGNOSIS — E1165 Type 2 diabetes mellitus with hyperglycemia: Secondary | ICD-10-CM | POA: Diagnosis not present

## 2023-09-29 DIAGNOSIS — Z299 Encounter for prophylactic measures, unspecified: Secondary | ICD-10-CM | POA: Diagnosis not present

## 2023-09-29 DIAGNOSIS — R5383 Other fatigue: Secondary | ICD-10-CM | POA: Diagnosis not present

## 2023-09-29 DIAGNOSIS — E1165 Type 2 diabetes mellitus with hyperglycemia: Secondary | ICD-10-CM | POA: Diagnosis not present

## 2023-09-29 DIAGNOSIS — I10 Essential (primary) hypertension: Secondary | ICD-10-CM | POA: Diagnosis not present

## 2023-09-29 DIAGNOSIS — I714 Abdominal aortic aneurysm, without rupture, unspecified: Secondary | ICD-10-CM | POA: Diagnosis not present

## 2023-09-29 DIAGNOSIS — E7800 Pure hypercholesterolemia, unspecified: Secondary | ICD-10-CM | POA: Diagnosis not present

## 2023-09-29 DIAGNOSIS — Z0000 Encounter for general adult medical examination without abnormal findings: Secondary | ICD-10-CM | POA: Diagnosis not present

## 2023-10-03 DIAGNOSIS — E7800 Pure hypercholesterolemia, unspecified: Secondary | ICD-10-CM | POA: Diagnosis not present

## 2023-10-03 DIAGNOSIS — Z79899 Other long term (current) drug therapy: Secondary | ICD-10-CM | POA: Diagnosis not present

## 2023-10-03 DIAGNOSIS — R5383 Other fatigue: Secondary | ICD-10-CM | POA: Diagnosis not present

## 2023-10-06 DIAGNOSIS — E1165 Type 2 diabetes mellitus with hyperglycemia: Secondary | ICD-10-CM | POA: Diagnosis not present

## 2023-11-05 DIAGNOSIS — E1165 Type 2 diabetes mellitus with hyperglycemia: Secondary | ICD-10-CM | POA: Diagnosis not present

## 2023-12-05 DIAGNOSIS — E1140 Type 2 diabetes mellitus with diabetic neuropathy, unspecified: Secondary | ICD-10-CM | POA: Diagnosis not present

## 2023-12-05 DIAGNOSIS — M79672 Pain in left foot: Secondary | ICD-10-CM | POA: Diagnosis not present

## 2023-12-05 DIAGNOSIS — B351 Tinea unguium: Secondary | ICD-10-CM | POA: Diagnosis not present

## 2023-12-05 DIAGNOSIS — M79671 Pain in right foot: Secondary | ICD-10-CM | POA: Diagnosis not present

## 2023-12-05 DIAGNOSIS — E1151 Type 2 diabetes mellitus with diabetic peripheral angiopathy without gangrene: Secondary | ICD-10-CM | POA: Diagnosis not present

## 2023-12-06 DIAGNOSIS — E1165 Type 2 diabetes mellitus with hyperglycemia: Secondary | ICD-10-CM | POA: Diagnosis not present
